# Patient Record
Sex: Female | Born: 1955 | Race: White | Hispanic: No | Marital: Married | Smoking: Never smoker
Health system: Southern US, Community
[De-identification: ages and names within clinical notes are randomized; demographics above are authoritative.]

## PROBLEM LIST (undated history)

## (undated) DIAGNOSIS — I341 Nonrheumatic mitral (valve) prolapse: Secondary | ICD-10-CM

## (undated) DIAGNOSIS — I3139 Other pericardial effusion (noninflammatory): Secondary | ICD-10-CM

## (undated) DIAGNOSIS — M545 Low back pain, unspecified: Secondary | ICD-10-CM

## (undated) DIAGNOSIS — M71422 Calcium deposit in bursa, left elbow: Secondary | ICD-10-CM

## (undated) DIAGNOSIS — K859 Acute pancreatitis without necrosis or infection, unspecified: Secondary | ICD-10-CM

## (undated) DIAGNOSIS — K589 Irritable bowel syndrome without diarrhea: Secondary | ICD-10-CM

## (undated) DIAGNOSIS — Z8489 Family history of other specified conditions: Secondary | ICD-10-CM

## (undated) DIAGNOSIS — M8580 Other specified disorders of bone density and structure, unspecified site: Secondary | ICD-10-CM

## (undated) DIAGNOSIS — M109 Gout, unspecified: Secondary | ICD-10-CM

## (undated) DIAGNOSIS — K5909 Other constipation: Secondary | ICD-10-CM

## (undated) DIAGNOSIS — I10 Essential (primary) hypertension: Secondary | ICD-10-CM

## (undated) DIAGNOSIS — E119 Type 2 diabetes mellitus without complications: Secondary | ICD-10-CM

## (undated) DIAGNOSIS — I313 Pericardial effusion (noninflammatory): Secondary | ICD-10-CM

## (undated) DIAGNOSIS — E785 Hyperlipidemia, unspecified: Secondary | ICD-10-CM

## (undated) DIAGNOSIS — K219 Gastro-esophageal reflux disease without esophagitis: Secondary | ICD-10-CM

## (undated) DIAGNOSIS — J45909 Unspecified asthma, uncomplicated: Secondary | ICD-10-CM

## (undated) DIAGNOSIS — Z8719 Personal history of other diseases of the digestive system: Secondary | ICD-10-CM

## (undated) DIAGNOSIS — I499 Cardiac arrhythmia, unspecified: Secondary | ICD-10-CM

## (undated) DIAGNOSIS — G8929 Other chronic pain: Secondary | ICD-10-CM

## (undated) DIAGNOSIS — G5600 Carpal tunnel syndrome, unspecified upper limb: Secondary | ICD-10-CM

## (undated) DIAGNOSIS — K5900 Constipation, unspecified: Secondary | ICD-10-CM

## (undated) DIAGNOSIS — N200 Calculus of kidney: Secondary | ICD-10-CM

## (undated) DIAGNOSIS — J309 Allergic rhinitis, unspecified: Secondary | ICD-10-CM

## (undated) DIAGNOSIS — M797 Fibromyalgia: Secondary | ICD-10-CM

## (undated) HISTORY — DX: Hyperlipidemia, unspecified: E78.5

## (undated) HISTORY — PX: TONSILLECTOMY AND ADENOIDECTOMY: SUR1326

## (undated) HISTORY — DX: Gout, unspecified: M10.9

## (undated) HISTORY — PX: LUMBAR LAMINECTOMY: SHX95

## (undated) HISTORY — PX: SHOULDER SURGERY: SHX246

## (undated) HISTORY — DX: Other specified disorders of bone density and structure, unspecified site: M85.80

## (undated) HISTORY — DX: Fibromyalgia: M79.7

## (undated) HISTORY — DX: Acute pancreatitis without necrosis or infection, unspecified: K85.90

## (undated) HISTORY — DX: Nonrheumatic mitral (valve) prolapse: I34.1

## (undated) HISTORY — DX: Type 2 diabetes mellitus without complications: E11.9

## (undated) HISTORY — DX: Personal history of other diseases of the digestive system: Z87.19

## (undated) HISTORY — DX: Other pericardial effusion (noninflammatory): I31.39

## (undated) HISTORY — DX: Carpal tunnel syndrome, unspecified upper limb: G56.00

## (undated) HISTORY — DX: Constipation, unspecified: K59.00

## (undated) HISTORY — DX: Allergic rhinitis, unspecified: J30.9

## (undated) HISTORY — DX: Pericardial effusion (noninflammatory): I31.3

## (undated) HISTORY — DX: Gastro-esophageal reflux disease without esophagitis: K21.9

## (undated) HISTORY — DX: Low back pain: M54.5

## (undated) HISTORY — DX: Calculus of kidney: N20.0

## (undated) HISTORY — DX: Irritable bowel syndrome, unspecified: K58.9

## (undated) HISTORY — DX: Low back pain, unspecified: M54.50

## (undated) HISTORY — DX: Other constipation: K59.09

## (undated) HISTORY — DX: Other chronic pain: G89.29

---
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
## 1986-01-09 HISTORY — PX: TOTAL ABDOMINAL HYSTERECTOMY W/ BILATERAL SALPINGOOPHORECTOMY: SHX83

## 1991-01-10 HISTORY — PX: CHOLECYSTECTOMY: SHX55

## 2000-06-11 ENCOUNTER — Inpatient Hospital Stay (HOSPITAL_COMMUNITY): Admission: EM | Admit: 2000-06-11 | Discharge: 2000-06-12 | Payer: Self-pay | Admitting: Emergency Medicine

## 2000-06-11 ENCOUNTER — Encounter: Payer: Self-pay | Admitting: Emergency Medicine

## 2000-06-12 ENCOUNTER — Encounter: Payer: Self-pay | Admitting: Cardiology

## 2002-03-06 ENCOUNTER — Encounter: Payer: Self-pay | Admitting: Family Medicine

## 2002-03-06 ENCOUNTER — Encounter: Admission: RE | Admit: 2002-03-06 | Discharge: 2002-03-06 | Payer: Self-pay | Admitting: Family Medicine

## 2005-09-19 ENCOUNTER — Encounter: Admission: RE | Admit: 2005-09-19 | Discharge: 2005-09-19 | Payer: Self-pay | Admitting: Family Medicine

## 2005-11-09 ENCOUNTER — Ambulatory Visit (HOSPITAL_COMMUNITY): Admission: RE | Admit: 2005-11-09 | Discharge: 2005-11-09 | Payer: Self-pay | Admitting: Neurosurgery

## 2005-12-28 ENCOUNTER — Ambulatory Visit: Payer: Self-pay | Admitting: Cardiology

## 2006-01-11 ENCOUNTER — Ambulatory Visit: Payer: Self-pay | Admitting: Cardiology

## 2006-01-11 ENCOUNTER — Encounter: Payer: Self-pay | Admitting: Cardiology

## 2006-01-11 ENCOUNTER — Ambulatory Visit: Payer: Self-pay

## 2006-01-18 ENCOUNTER — Ambulatory Visit: Payer: Self-pay | Admitting: Cardiology

## 2006-01-31 ENCOUNTER — Inpatient Hospital Stay (HOSPITAL_COMMUNITY): Admission: RE | Admit: 2006-01-31 | Discharge: 2006-02-03 | Payer: Self-pay | Admitting: Orthopedic Surgery

## 2006-07-18 ENCOUNTER — Encounter: Admission: RE | Admit: 2006-07-18 | Discharge: 2006-07-18 | Payer: Self-pay | Admitting: Orthopedic Surgery

## 2006-08-20 ENCOUNTER — Encounter: Admission: RE | Admit: 2006-08-20 | Discharge: 2006-08-20 | Payer: Self-pay | Admitting: Family Medicine

## 2006-09-19 ENCOUNTER — Ambulatory Visit: Payer: Self-pay | Admitting: Cardiology

## 2006-10-12 ENCOUNTER — Encounter: Admission: RE | Admit: 2006-10-12 | Discharge: 2006-10-12 | Payer: Self-pay | Admitting: Orthopedic Surgery

## 2007-03-08 ENCOUNTER — Encounter: Admission: RE | Admit: 2007-03-08 | Discharge: 2007-03-08 | Payer: Self-pay | Admitting: Orthopedic Surgery

## 2007-06-06 ENCOUNTER — Ambulatory Visit: Payer: Self-pay | Admitting: Cardiology

## 2007-06-06 LAB — CONVERTED CEMR LAB
ALT: 171 units/L — ABNORMAL HIGH (ref 0–35)
AST: 182 units/L — ABNORMAL HIGH (ref 0–37)
Alkaline Phosphatase: 122 units/L — ABNORMAL HIGH (ref 39–117)
Bilirubin, Direct: 0.2 mg/dL (ref 0.0–0.3)
CO2: 29 meq/L (ref 19–32)
Chloride: 103 meq/L (ref 96–112)
Cholesterol: 239 mg/dL (ref 0–200)
Creatinine, Ser: 0.9 mg/dL (ref 0.4–1.2)
Hgb A1c MFr Bld: 6 % (ref 4.6–6.0)
Potassium: 3.2 meq/L — ABNORMAL LOW (ref 3.5–5.1)
Total Bilirubin: 1.1 mg/dL (ref 0.3–1.2)
Total CHOL/HDL Ratio: 7.5

## 2007-06-14 ENCOUNTER — Ambulatory Visit: Payer: Self-pay

## 2007-07-17 ENCOUNTER — Ambulatory Visit: Payer: Self-pay | Admitting: Cardiology

## 2008-05-27 ENCOUNTER — Encounter (INDEPENDENT_AMBULATORY_CARE_PROVIDER_SITE_OTHER): Payer: Self-pay | Admitting: *Deleted

## 2008-10-29 DIAGNOSIS — Z8719 Personal history of other diseases of the digestive system: Secondary | ICD-10-CM

## 2008-10-29 DIAGNOSIS — R7309 Other abnormal glucose: Secondary | ICD-10-CM

## 2008-10-29 DIAGNOSIS — E8881 Metabolic syndrome: Secondary | ICD-10-CM

## 2008-10-29 HISTORY — DX: Personal history of other diseases of the digestive system: Z87.19

## 2008-11-06 ENCOUNTER — Ambulatory Visit: Payer: Self-pay | Admitting: Cardiology

## 2009-07-20 ENCOUNTER — Ambulatory Visit: Payer: Self-pay | Admitting: Emergency Medicine

## 2009-07-20 DIAGNOSIS — J309 Allergic rhinitis, unspecified: Secondary | ICD-10-CM

## 2009-07-20 DIAGNOSIS — K219 Gastro-esophageal reflux disease without esophagitis: Secondary | ICD-10-CM | POA: Insufficient documentation

## 2009-07-20 DIAGNOSIS — R0602 Shortness of breath: Secondary | ICD-10-CM

## 2009-07-20 HISTORY — DX: Allergic rhinitis, unspecified: J30.9

## 2010-01-30 ENCOUNTER — Encounter: Payer: Self-pay | Admitting: Family Medicine

## 2010-02-10 NOTE — Assessment & Plan Note (Signed)
Summary: dyspnea   Visit Type:  Initial Consult Copy to:  Dr. Keturah Barre Primary Provider/Referring Provider:  Lucila Maine..[facility] in [place]  CC:  Pulmonary consult for dyspnea.  The patient c/o increased sob with exertion x2 months. The patient was given a sample of Symbicort and says her breathing improved while using this medication.Marland Kitchen  History of Present Illness: 55 yo never smoker, hx of HTN, allergic rhinitis, small benign pericardial effusion (1991) stable by TTE in 1/11 (Dr Daleen Squibb). Referred for evaluation of dyspnea and cough. She reports that she was well until 2 months ago, when she developed URI symptoms. Primarily dry cough, non-prod at first but evolved clear sputum. Was treated with corticosteroids, abx. Then she was started on BD's - Symbicort and albuterol/atrovent. Currently on nasal steroid, no NSW's. She is still having UA irritation, hoarse voice, some SOB and UA wheezing.   Preventive Screening-Counseling & Management  Alcohol-Tobacco     Alcohol drinks/day: 0     Smoking Status: never  Current Medications (verified): 1)  Tenormin 50 Mg Tabs (Atenolol) .Marland Kitchen.. 1 Tab Once Daily 2)  Nexium 40 Mg Cpdr (Esomeprazole Magnesium) .Marland Kitchen.. 1 Tab Two Times A Day 3)  Lidoderm 5 % Ptch (Lidocaine) .Marland Kitchen.. 1 Patch Every 12 Hours On and Then 12 Hours Off 4)  Artificial Tears 1.4 % Soln (Polyvinyl Alcohol) .... Use As Directed in Ou Bid 5)  Hydrocodone-Acetaminophen 10-325 Mg Tabs (Hydrocodone-Acetaminophen) .Marland Kitchen.. 1 Tab Three Times A Day 6)  Flexeril 10 Mg Tabs (Cyclobenzaprine Hcl) .Marland Kitchen.. 1 Tab Three Times A Day 7)  Miralax  Powd (Polyethylene Glycol 3350) .... Use As Directed 8)  Betamethasone Valerate 0.1 % Crea (Betamethasone Valerate) .... Use As Directed Daily 9)  Nasacort Aq 55 Mcg/act Aers (Triamcinolone Acetonide(Nasal)) .... As Needed 10)  Hyomax-Sl 0.125 Mg Subl (Hyoscyamine Sulfate) .... As Needed 11)  Meclizine Hcl 25 Mg Tabs (Meclizine Hcl) .... As  Needed 12)  Amoxicillin 500 Mg Caps (Amoxicillin) .... As Needed For Dental Work 13)  Ibuprofen 200 Mg Tabs (Ibuprofen) .... As Needed 14)  Symbicort 160-4.5 Mcg/act Aero (Budesonide-Formoterol Fumarate) .... 2 Puffs Two Times A Day 15)  Ventolin Hfa 108 (90 Base) Mcg/act Aers (Albuterol Sulfate) .... 2 Puffs Every 6 Hours As Needed 16)  Atrovent Hfa 17 Mcg/act Aers (Ipratropium Bromide Hfa) .... 2 Puffs Every 6 Hours As Needed  Allergies: 1)  ! Celebrex 2)  ! Vioxx 3)  ! Sulfa 4)  ! * Ambien  Family History: Mother: deceased at 52 .Marland KitchenCHF Father: deceased at 39.Marland KitchenCHF and COPD Family History Asthma---father Family History Breast Cancer---mother, aunt and sister Family History Colon Cancer---mother Family History Lung Cancer---mother History of stomach cancer---aunt Family History Emphysema ---mother and father  Social History: Alcohol drinks/day:  0  Review of Systems       The patient complains of shortness of breath with activity, shortness of breath at rest, productive cough, indigestion, loss of appetite, nasal congestion/difficulty breathing through nose, sneezing, and joint stiffness or pain.  The patient denies non-productive cough, coughing up blood, chest pain, irregular heartbeats, acid heartburn, weight change, abdominal pain, difficulty swallowing, sore throat, tooth/dental problems, headaches, itching, ear ache, anxiety, depression, hand/feet swelling, rash, change in color of mucus, and fever.    Vital Signs:  Patient profile:   55 year old female Height:      65 inches (165.10 cm) Weight:      229 pounds (104.09 kg) BMI:     38.25 O2 Sat:  100 % on Room air Temp:     98.0 degrees F (36.67 degrees C) oral Pulse rate:   88 / minute BP sitting:   124 / 80  (left arm) Cuff size:   large  Vitals Entered By: Michel Bickers CMA (July 20, 2009 1:57 PM)  O2 Sat at Rest %:  100 O2 Flow:  Room air CC: Pulmonary consult for dyspnea.  The patient c/o increased sob with  exertion x2 months. The patient was given a sample of Symbicort and says her breathing improved while using this medication. Comments Medications reviewed. Daytime phone verified. Michel Bickers CMA  July 20, 2009 2:10 PM   Physical Exam  General:  normal appearance, healthy appearing, and obese.   Head:  normocephalic and atraumatic Eyes:  conjunctiva and sclera clear Nose:  no deformity, discharge, inflammation, or lesions Mouth:  no deformity or lesions Neck:  no masses, thyromegaly, or abnormal cervical nodes Lungs:  clear bilaterally to auscultation Heart:  regular rate and rhythm, S1, S2 without murmurs, rubs, gallops, or clicks Abdomen:  not examined Msk:  no deformity or scoliosis noted with normal posture Extremities:  no clubbing, cyanosis, edema, or deformity noted Neurologic:  non-focal Skin:  intact without lesions or rashes Psych:  alert and cooperative; normal mood and affect; normal attention span and concentration   X-ray  Procedure date:  05/07/2009  Findings:      CXR: no infiltrates, normal film  Impression & Recommendations:  Problem # 1:  DYSPNEA (ICD-786.05)  This seems to be manifesting itself as throat irritation, cough, wheeze. Suspect residual UA irritation after URI. The Symbicort may be irritating this. Other contributors probably allergies and GERD.  - Nasonex + NSW + loratadine - double nexium for 2 weeks.  - stop symbicort - full PFT to r/o lower airways disease - rov 1 month  Orders: Consultation Level IV (81191)  Medications Added to Medication List This Visit: 1)  Symbicort 160-4.5 Mcg/act Aero (Budesonide-formoterol fumarate) .... 2 puffs two times a day 2)  Ventolin Hfa 108 (90 Base) Mcg/act Aers (Albuterol sulfate) .... 2 puffs every 6 hours as needed 3)  Atrovent Hfa 17 Mcg/act Aers (Ipratropium bromide hfa) .... 2 puffs every 6 hours as needed  Patient Instructions: 1)  Continue your nasonex 2 sprays each side once daily  2)   Start nasal saline washes once daily  3)  Start loratadine 10mg  by mouth once daily  4)  Increase your Nexium to two times a day for the next 2 weeks, then go back to once daily  5)  Stop Symbicort 6)  We will perform full PFT's at the time of your next appointment.  7)  Follow up with Dr Delton Coombes in 1 months or as needed

## 2010-05-24 NOTE — Assessment & Plan Note (Signed)
[facility]                            CARDIOLOGY OFFICE NOTE   NAME:Arnold, Carolyn RANDOL                        MRN:          [id number]  DATE:07/17/2007                            DOB:          11-05-1955    [name] returns today for followup.  She has been recently evaluated  by Dr. Lucila Maine.  I do not have his lab work.  She says her liver  function studies look much better.  She has changed her lifestyle and  her diet dramatically.  Her weight is down 1 pound.   When I checked blood work on Jun 06, 2007, her SGOT was 182.  Her SGPT  was 171.  This was higher than previous lab results.  Her total  cholesterol was 239 and HDL was 32.  Her LDL cholesterol was 186.2,  triglycerides 101, and her potassium was 3.2.  We also checked her  hemoglobin A1c, which was 6.0%, which is upper limits of normal range.   Her blood pressure today is 110/84, pulse 64 and regular, and weight is  240.  Rest of her exam is unchanged.   I had a long talk with [name] today.  She clearly is not someone that  I would treat with a statin, knowing about her history of increased LFTs  probably secondary to fatty liver.  If she dramatically changes her  lifestyle and LFTs normalize then I would consider a statin, however.   At the present time, I think therapeutic lifestyle choices are the best  approach.  By losing weight, she can probably also avoid type 2  diabetes.   I will see her back in 12 months or p.r.n.Marland Kitchen  She will follow closely  with Dr. Lorin Picket.     Thomas C. Daleen Squibb, MD, [facility]  Electronically Signed    TCW/MedQ  DD: 07/17/2007  DT: 07/18/2007  Job #: [id number]   cc:   Molly Maduro MD Lorin Picket

## 2010-05-24 NOTE — Assessment & Plan Note (Signed)
[facility]                            CARDIOLOGY OFFICE NOTE   NAME:Carolyn Arnold                        MRN:          [id number]  DATE:06/06/2007                            DOB:          1955-05-28    Ms. [name] returns today for further management of her metabolic syndrome.  Last time I saw her on September 19, 2006, I asked her to followup with  Dr. Lorin Picket concerning her glucose intolerance and whether or not she  could use a statin.  Per her recall, Dr. Lorin Picket felt she was still  borderline, but not a type 2 diabetic yet.  I do not have any laboratory  data.   Unfortunately, she continues to gain weight and has gone from 238 to  241.  She blames this on the fact she cannot exercise though I have  tried to tell her that she is not going to lose weight by doing such.  She would have to exercise or swim 10 hours a week just to lose 1 pound,  even if she kept her diet stable.  She says she loves potatoes and she  adds sugar to her coffee, need I say more.   She is a delightful lady and now unfortunately is in a high risk  category of having an acute coronary event.  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  Fortunately, she does not smoke.  Blood pressure has been under good  control.   She has been plagued with elevated LFTs in the past and that has  precluded somewhat trying a statin.  This has been attributed to fatty  liver in the past.  Please see my previous notes.   She is having some chest discomfort at present.  It is not always  exertion related.  It is quite worrisome, however.  She denies  orthopnea, PND or peripheral edema.   Her current medications are:  1. Tenormin 50 mg a day.  2. Hydroxyzine 25 mg a day.  3. Xanax 0.5 mg a day.  4. Nexium 40 mg p.o. b.i.d.  5. Lidoderm patches.  6. Artificial tears.  7. Acai Berry extract 2 twice a day.   Her blood pressure today is  110/80, her pulse is 78 and regular.  Weight  is 241, up 3.  HEENT:  Normocephalic, atraumatic.  PERRLA.  Extraocular movements  intact.  Sclerae clear.  Facial symmetry is normal.  Carotid upstrokes are equal bilaterally without bruits, no JVD.  Thyroid  is not enlarged.  Trachea is midline.  LUNGS:  Clear.  HEART:  Reveals a poorly appreciated PMI.  Normal S1, S2.  ABDOMINAL EXAM:  Soft, good bowel sounds.  No midline bruit.  EXTREMITIES:  No cyanosis, clubbing or edema.  Pulses are intact.  NEURO EXAM:  Grossly intact.  SKIN:  Unremarkable.  MUSCULOSKELETAL:  Shows chronic arthritic changes.   I had a long talk with Ms. Dobbins today.  I feel very strongly that she  needs a major risk factor  modification.  Fortunately, her blood pressure  is under good control, but I am very concerned that she is early  diabetic.  In addition, I am very concerned she needs her lipids  treated.  Please see my previous notes.   PLAN:  1. CMP, lipid panel, C-reactive protein and hemoglobin A1c today.  2. Adenosine rest stress Myoview to rule out obstructive coronary      disease.   I will have her return in 4 weeks.     Thomas C. Daleen Squibb, MD, [facility]  Electronically Signed    TCW/MedQ  DD: 06/06/2007  DT: 06/06/2007  Job #: [id number]   cc:   Lucila Maine, MD

## 2010-05-24 NOTE — Assessment & Plan Note (Signed)
[facility]                            CARDIOLOGY OFFICE NOTE   NAME:Carolyn Arnold, Carolyn Arnold                        MRN:          [id number]  DATE:09/19/2006                            DOB:          04-06-1955    Carolyn Arnold returns today for further management of her metabolic syndrome.  She had a fasting blood sugar recently of 122 by Dr. Lorin Picket.  He  apparently performed a glucose-tolerance test on her, which she does not  have the results of yet.   Her hemoglobin A1c in January here was still within normal limits.  She  is glucose intolerant and will soon be a full-blown diabetic if not  already.   Because of her arthritis and her ability to get around, her weight has  stayed the same at 238.  Her blood pressure has been under good control  however, and she remains on Tenormin 50 mg at bedtime.   Her most recent lipid profile showed a total cholesterol of 229, HDL 39,  triglycerides 152, LDL of 160.  Her SGPT, SGOT were elevated at 115 and  116, which is not quite 3 times normal.  This is probably fatty liver as  she was also told by Dr. Lorin Picket.  Apparently, she has refused to have a  liver biopsy in the past per a note I see.   Her blood pressure today is 110/78.  Her pulse is 78 and regular.  Her  weight is 238.  HEENT:  Unchanged.  CAROTIDS:  Full without bruits.  There is no jugular venous distention.  Thyroid is not enlarged.  Trachea is midline.  LUNGS:  Clear.  HEART:  Revealed a regular rate and rhythm without gallop.  PMI could not be  appreciated.  ABDOMINAL EXAMINATION:  Obese with good sounds.  Organomegaly could not  be appreciated.  EXTREMITIES:  Reveal trace to 1+ pitting edema. Pulses are intact.  NEUROLOGICAL EXAMINATION:  Is intact.  Her electrocardiogram shows  normal sinus rhythm with low voltage QRS.  There has been no change.   ASSESSMENT:  Carolyn Arnold is probably now type 2 diabetic.  If that is the  case, I would recommend a statin to  lower LDL below 100.  I would  certainly shoot for 30 to 40% lowering of LDL if at all possible.  The  fact that her LFTs are up complicates matters but this is probably fatty  liver and could be monitored closely.  She states that she took a statin  in the past from Dr. Lorin Picket but she could not tolerate it.   After much discussion, I decided to let her follow up with Dr. Lorin Picket  concerning her glucose intolerance and question of whether she needs a  statin or not.  He will have to monitor it anyway.  He will also know  which statin she could not take in the past.   Again, I encouraged her to try to keep her weight down.  I will plan on  seeing her back in 6 months.     Thomas C. Wall, MD,  [facility]  Electronically Signed    TCW/MedQ  DD: 09/19/2006  DT: 09/20/2006  Job #: [id number]   cc:   Carolyn Arnold

## 2010-05-27 NOTE — Op Note (Signed)
NAMERAINAH, KIRSHNER                 ACCOUNT NO.:  [id number]   MEDICAL RECORD NO.:  [id number]          PATIENT TYPE:  INP   LOCATION:  2550                         FACILITY:  [facility]   PHYSICIAN:  Nelda Severe, MD      DATE OF BIRTH:  [birth year]   DATE OF PROCEDURE:  01/31/2006  DATE OF DISCHARGE:                               OPERATIVE REPORT   SURGEON:  Dr. Nelda Severe.   ASSISTANT:  Lianne Cure, PA-C   PREOPERATIVE DIAGNOSIS:  L5-S1 isthmic spondylolisthesis, status post  Ray cage fusion, L4-5 spondylosis with pseudoarthrosis at L5-S1.   POSTOPERATIVE DIAGNOSIS:  L5-S1 isthmic spondylolisthesis, status post  Ray cage fusion, L4-5 spondylosis with pseudoarthrosis at L5-S1.   OPERATIVE PROCEDURE:  L4-5, L5-S1 posterolateral fusion with a pedicle  screws (Invictus/Aesculap), rods and right posterior iliac crest  autogenous graft.   OPERATIVE NOTE:  The patient was placed under general endotracheal  anesthesia.  A gram of vancomycin was infused.  Sequential compression  devices were placed on both lower extremities.  Electronic monitoring  devices were placed on the scalp, upper extremities and lower  extremities.   The patient was positioned prone on the [place] frame.  Care was taken  to position the upper extremities so as to avoid hyperflexion and  abduction of the shoulders and hyperflexion of the elbows.  Upper  extremities were padded from axilla to hands with foam.  There was no  pressure on the cubital tunnels.  The thighs, knees and shins were  padded with pillows.   The previous incision in lumbar area was marked with a skin marker.  Lumbar area was prepped with DuraPrep.  Drapes were placed and secured  with Ioban.   The skin was scored in line with the incision including an ellipse  around the previous scar.  The subcutaneous tissue was injected with a  mixture of 0.25% Marcaine with epinephrine 1% lidocaine.   The scar was excised and the incision  extended approximately 2 cm  distally and proximally from the previous incision.  Dissection was  carried down through a very thick adipose layer to identify the spinous  processes.  Paraspinal muscles reflected bilaterally.  Cross-table  lateral radiograph was taken with a Kocher on what proved to be the L4  spinous process.  The transverse process of L4, transverse process of L5  and ala of the sacrum was exposed bilaterally.   Pedicle holes were made at S1, L5 and L4, first on the left and then on  the right.  Radiopaque markers were placed.  Cross-table lateral  radiograph showed satisfactory position of markers except for the need  to direct the left-sided S1 hole slightly more distally.   In the meantime we harvested a right posterior iliac crest graft through  a short vertical incision made just lateral to the iliac crest.  The  soft tissue on the superior aspect of the crest was removed and the  crest fenestrated with a Leksell rongeur.  A special bone harvesting  tube was then used to remove a satisfactory quantity of cancellous graft  from between the inner and outer tables of the iliac crest on the right  side.  The bony defect was packed with Gelfoam.   The bone graft harvested was then mixed in 15 mL of Vitoss (beta  tricalcium phosphate) morsels.   Next we decorticated the ala of sacrum and transverse processes of L5-L4  as well as the lateral aspect of the superior articular process of L5  and L4 on the right side.  Half of the graft/Vitoss mixture was then  inserted posterolaterally from the ala of the sacrum distally to the  lateral aspect of the L4 superior articular process.   Next screws were inserted at S1, L5 and L4 on the left side.  Each screw  was tested and the current to stimulate distal EMG activity recorded.  Each instance the current was at a high enough level to suggest there  was very little likelihood of any contact between screw and nerve root.   Precontoured rod was then attached provisionally coupled.   We then moved to the left side where the decortication the ala of the  sacrum, lateral surface of the superior articular process of S1, L5 and  L4 as well as transverse processes of L4 and L5 was carried out.  The  rest of the graft was packed in posterolaterally.  We did resect the L4-  5 apophyseal joint on the left side as well.   Next screws were placed and stimulated as described for the right side.  As noted above, the left S1 screw was directed slightly more distally.  All stimulation levels were measured and in each instance, distal EMG  activity was not triggered at levels suggestive of any contact between  nerve root and screw.  Cross-table lateral radiograph was taken after  provisionally fixating a precontoured titanium rod to the screws at S1,  L5 and L4.  The set screws were then all torqued.  The tabs were broken  off the top of the screws and a total of 12 tabs given to the scrub  tech.   We then closed the fascia with continuous #1 Vicryl suture.  A one-  eighth inch Hemovac drain was placed subcutaneous layer of the central  wound, brought out through the subcutaneous layer of the bone graft  harvest wound.  Subcutaneous layer of both wounds was closed using  continuous and interrupted 2-0 Vicryl suture.  The skin was then closed  with a subcuticular running 3-0 undyed Vicryl suture.  Skin edges were  reinforced with Steri-Strips.  An antibiotic ointment dressing was  applied and secured with OpSite after the eighth inch Hemovac drain was  secured with a basket weave type of 2-0 nylon suture.   There no intraoperative complications.  The blood loss estimated at  about 400 mL.   Sponge, needle counts were correct.      Nelda Severe, MD  Electronically Signed     MT/MEDQ  D:  01/31/2006  T:  01/31/2006  Job:  [id number]

## 2010-05-27 NOTE — Assessment & Plan Note (Signed)
[facility]                            CARDIOLOGY OFFICE NOTE   NAME:Kowalczyk, ALEINA BURGIO                        MRN:          [id number]  DATE:01/18/2006                            DOB:          July 31, 1955    Ms. Bertelson returns today to discuss the findings of her 2D echocardiogram  and her blood work.  Please see my note from December 28, 2005.   She clearly has metabolic syndrome with borderline blood pressure,  __________ obesity, and glucose intolerance, and mixed hyperlipidemia.  Her blood sugar was 111 fasting.  Her total cholesterol was 250,  triglycerides 189, HDL 33.4, LDL 148.2.  The rest of her blood work was  normal.  Her hemoglobin A1c was 2, within normal range of 5.6%.   Her 2D echocardiogram was essentially normal except for some mild left  atrial enlargement.   I have sat with Ms. Ironside for about 15 minutes and discussed again  therapeutic lifestyle changes.  She is so immobile with her back  problem, she has to go on a very strict diet.  I have advised her to try  to lose 3-4 pounds a month.  I will see her back in 6 months.  If she  has lost about 20 pounds, we will recheck fasting blood work.  She seems  motivated, and hopefully will stay on track.     Thomas C. Daleen Squibb, MD, [facility]  Electronically Signed    TCW/MedQ  DD: 01/18/2006  DT: 01/18/2006  Job #: [id number]   cc:   Nelda Severe, MD  Dr. Lucila Maine

## 2010-05-27 NOTE — Assessment & Plan Note (Signed)
[facility]                            CARDIOLOGY OFFICE NOTE   NAME:Carolyn Arnold                        MRN:          [id number]  DATE:12/28/2005                            DOB:          1955-05-03    CHIEF COMPLAINT:  Clearance for surgery on her back.   HISTORY OF PRESENT ILLNESS:  [name] is a delightful 55 year old  married white female from [place] who is scheduled to have lower back  surgery some time in January.  She is having a lot of pain and her  mobility has been significantly reduced.   She has had several cardiac evaluations in the past for atypical chest  pain.  The most recent evaluation was in March 2005.  She had an  adenosine Cardiolite which was negative for ischemia with normal  systolic function, EF of 67%.  In addition, she had a 2D echocardiogram  at the time that showed a tiny pericardial effusion, EF of 60% to 65%.  Her left atrium was mildly enlarged.  She had minimal mitral  regurgitation.   She has been evaluated for palpitations in the past and has been told  she premature beats.   She does have several cardiac risk factors including obesity, sedentary  lifestyle, hyperlipidemia, metabolic syndrome.   In fact, looking back at her blood work in 2005, she had an elevated  blood sugar of 110, she had a low HDL of 30.5, total cholesterol of 245,  triglycerides of 192, LDL 176.  Her SGOT and SGPT were increased  substantially at 110 and 149.  In fact, she has had a cholecystectomy,  but she ended up having an ultrasound at that time which showed diffuse  fatty infiltration of her liver.   She has done nothing since then to lose weight and has not been focused  on this as she admits today.   PAST MEDICAL HISTORY:  SHE IS INTOLERANT OF SULFA, [employer] INHIBITORS,  AMBIEN, IBUPROFEN.  SHE DID HAVE SEVERE NAUSEA WITH A MYELOGRAM IN THE  PAST, BUT NO DYE REACTION PER SE.   She does not drink, smoke, does not use any  recreational drugs.   SURGICAL HISTORY:  1. She had a hysterectomy in 1990.  2. Back surgery in 1998.  3. Cholecystectomy with pancreatitis in 2004.  4. Shoulder surgery x2 for rotator cuff in 2000.  5. Fractured elbow in 1973.   CURRENT MEDICATIONS:  1. Tenormin 50 mg p.o. nightly.  2. Hydroxyzine 25 mg 2 p.o. nightly.  3. Xanax 1 mg 1 to 2 p.o. nightly p.r.n.  4. Nexium 40 mg 2 p.o. b.i.d.  5. Lorabid 7.5/500 one q.6 h. p.r.n.  6. Flexeril 10 mg 1 p.o. b.i.d. or t.i.d.  7. Betamethasone cream.  8. Cortisone nasal spray p.r.n.   SOCIAL HISTORY:  She is a Designer, jewellery, currently on disability.  She is married and has 2 children.   REVIEW OF SYSTEMS:  Other than the HPI, pertinent positives are a  history of mild diverticulosis, no melena or hematochezia, history of  chronic reflux, history of arthritis.  EXAMINATION:  Her blood pressure today is 129/89.  Heart rate is 65, in  sinus rhythm.  She is 5 feet 5 inches and weighs 230.  She has nonspecific ST segment changes on her EKG, which are unchanged  since 2005 by the way.  She is in no acute distress.  SKIN:  Warm and dry.  HEENT:  Normocephalic and atraumatic.  PERRLA.  Extraocular movements  intact.  Sclerae clear.  Facial symmetry is normal.  Dentition is  satisfactory.  Carotid upstrokes are equal bilaterally without bruits.  There is no  JVD.  Thyroid is not enlarged. Neck is supple.  LYMPH EXAM:  Negative.  LUNGS:  Clear to auscultation and percussion.  HEART:  Reveals a poorly appreciated PMI.  She has normal S1 and S2.  ABDOMINAL EXAM:  Protuberant with good bowel sounds.  Organomegaly could  not be assessed.  EXTREMITIES:  Reveal trace edema bilaterally.  Pulses were present.  NEUROLOGIC EXAM:  Grossly intact.  MUSCULOSKELETAL:  Other than her back injury, is intact.  SKIN:  Intact.   ASSESSMENT:  1. Atypical chest pain.  2. Palpitations.  3. History of PACs and PVCs.  4. Metabolic syndrome, or she  could be early diabetic already.   PLAN:  1. A 2D echocardiogram.  2. Fasting lipids, comprehensive metabolic panel, hemoglobin A1c, TSH,      magnesium.   She would like to sit down and talk about this once the results are in.  I spent a lot of time, greater than 20 minutes, talking to her about  metabolic syndrome and how to avoid diabetes  down the road.  Hope she can implement a program that she will be  proactive on after her surgery.     Thomas C. Daleen Squibb, MD, [facility]  Electronically Signed    TCW/MedQ  DD: 12/28/2005  DT: 12/28/2005  Job #: [id number]   cc:   Nelda Severe, MD  Lucila Maine, MD

## 2010-05-27 NOTE — Discharge Summary (Signed)
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  The day of admission she was taken to the operating room  where L4-5, L5-S1 posterolateral fusion with pedicle screws, rod and  autogenous bone graft was carried out.  Postoperatively, her course was  uncomplicated.  She was able to ambulate in physical therapy.  Her pain  control was adequate with oral analgesics (Norco 10) at the time of  discharge.   Discharge medications include  Norco 10, 90 tablets, p.r.n. for pain; Flexeril 10 mg t.i.d. p.r.n. for  spasm, 40 tablets; K-Dur 20 mEq b.i.d. for 7 days.   She is instructed to follow up with me in one month in the office and to  attend her primary care physician, Dr. Lorin Picket in [place] with regards to  hypokalemia.   Her wound was stable.  She was ambulatory with a walker.  She was  instructed to not bend or lift.   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   See above for postoperative instructions, medications, etc.      Nelda Severe, MD  Electronically Signed     MT/MEDQ  D:  03/30/2006  T:  03/30/2006  Job:  [id number]

## 2010-05-27 NOTE — Discharge Summary (Signed)
[place]  Patient:    Carolyn Arnold, Carolyn Arnold                        MRN: [id number] Adm. Date:  [id number] Disc. Date: 06/12/00 Attending:  Mirian Mo Dictator:   Jacolyn Reedy, P.A.C. CC:         Darrell Jewel, M.D.   Referring Physician Discharge [facility]  ADMITTING DIAGNOSIS:  Chest pain.  DISCHARGE DIAGNOSES: 1. Chest pain, question etiology.  Preliminary Cardiolite - probably breast    attenuation but resting study to be completed tomorrow.  Discharged home    to go to her fathers funeral. 2. Anxiety. 3. Prior history of pericardial effusion. 4. History of sinus tachycardia and premature ventricular contractions treated    with beta blockers. 5. Borderline hypertension. 6. Mild hyperlipidemia. 7. Family history of coronary artery disease.  BRIEF HISTORY AND PHYSICAL:  Please see H&P for details.  This is a 55 year old married white female patient of Dr. Anola Gurney with history of pericardial effusion of unknown etiology a few years ago.  She has multiple cardiac risk factors and now presents with intermittent chest pain, dyspnea for two weeks.  She initially had upper respiratory symptoms followed by upper chest pain with tenderness.  She was treated with antibiotics and Naprosyn without improvement.  She then developed abdominal fullness and lower chest burning with dyspnea.  She had some improvement with antacids but no benefit from her Prilosec.  She does have new T wave inversion on EKG and stress Cardiolite had been scheduled but was not done.  She came to the emergency room for further evaluation.  HOSPITAL COURSE:  The patient ruled out for an MI with negative CKs, MBs, and EKGs.  CT of the chest was reportedly negative.  Stress Cardiolite on the patient - baseline EKG showed normal sinus rhythm with anterior T wave inversion.  She exercised five minutes 30 seconds of the Bruce protocol obtaining a heart rate of 150; target  heart rate was 147.  The patient complained of chest burning, shortness of breath, and fatigue, and had 0.5 to 1 mm lateral ST depression and improvement in her T wave inversion. Cardiolite images showed question of breast attenuation but they recommended a resting study to be sure of this.  She also had a 2-D echo and preliminary shows mild MR.  Because her fathers funeral is today at 11 a.m. and she would like to attend, we will discharge her home and she will return tomorrow for her resting study.  She has been given a prescription for nitroglycerin and told to return to the hospital if she has recurrent chest pain.  If not, we will see her in the morning for her resting study.  She is also to call for a follow-up appointment with Dr. Juanito Doom in two weeks.  DISCHARGE MEDICATIONS: 1. Tenormin 50 mg q.d. 2. Premarin 0.625 mg q.d. 3. Prilosec 20 mg q.d. 4. amitriptyline 50 mg q.h.s. p.r.n. 5. Coated aspirin once a day. 6. Nitroglycerin p.r.n. 7. Ativan 0.5 mg t.i.d. p.r.n. - prescription of 30 tablets was given with no    refills.  LABORATORY VALUES:  A pH was 7.36, PCO2 40, PO2 48, bicarb 23, oxygen saturation 82 on admission.  Repeat ABG showed pH 7.44, PCO2 30, PO2 122 on room air - I believe the first one must have been a venous stick.  Troponins negative, CK-MBs negative. DD:  06/12/00 TD:  06/12/00 Job: [id number]

## 2011-04-25 ENCOUNTER — Other Ambulatory Visit: Payer: Self-pay | Admitting: Geriatric Medicine

## 2011-04-26 ENCOUNTER — Other Ambulatory Visit: Payer: Self-pay | Admitting: Geriatric Medicine

## 2011-04-26 DIAGNOSIS — Z1231 Encounter for screening mammogram for malignant neoplasm of breast: Secondary | ICD-10-CM

## 2011-04-28 ENCOUNTER — Ambulatory Visit
Admission: RE | Admit: 2011-04-28 | Discharge: 2011-04-28 | Disposition: A | Discharge status: Home / Self Care | Payer: BC Managed Care – PPO | Source: Ambulatory Visit | Attending: Geriatric Medicine | Admitting: Geriatric Medicine

## 2011-04-28 ENCOUNTER — Other Ambulatory Visit: Payer: Self-pay | Admitting: Geriatric Medicine

## 2011-04-28 DIAGNOSIS — N63 Unspecified lump in unspecified breast: Secondary | ICD-10-CM

## 2011-04-28 DIAGNOSIS — Z1231 Encounter for screening mammogram for malignant neoplasm of breast: Secondary | ICD-10-CM

## 2011-11-16 ENCOUNTER — Encounter (HOSPITAL_BASED_OUTPATIENT_CLINIC_OR_DEPARTMENT_OTHER): Payer: BC Managed Care – PPO

## 2012-01-29 ENCOUNTER — Telehealth: Payer: Self-pay | Admitting: Cardiology

## 2012-01-29 NOTE — Telephone Encounter (Signed)
Patient was last seen in 2010 and was only due to come back prn.  She is needing to have carpel tunnel surgery by Dr. Ulice Brilliant at [facility].  He will not schedule the surgery until patient has been given clearance.  They are only doing a block on her arm and not going to be put her to sleep.  Do you want me to schedule an appointment with you and another MD.  Will ask Dr Daleen Squibb  and call patient back.  She is not having any problems with her heart and will come in if needed.  Once clearance given, please fax to Dr. Lynnette Caffey at [phone].  No date for surgery has been scheduled

## 2012-01-29 NOTE — Telephone Encounter (Signed)
Note faxed to [facility] 

## 2012-01-29 NOTE — Telephone Encounter (Signed)
Pt  has to have carpel tunnel surgery and needs surgical clearance

## 2012-01-29 NOTE — Telephone Encounter (Signed)
You can clear her. No need for office visit.

## 2012-05-01 ENCOUNTER — Other Ambulatory Visit: Payer: Self-pay | Admitting: Orthopedic Surgery

## 2012-05-01 DIAGNOSIS — Z981 Arthrodesis status: Secondary | ICD-10-CM

## 2012-05-03 ENCOUNTER — Ambulatory Visit
Admission: RE | Admit: 2012-05-03 | Discharge: 2012-05-03 | Disposition: A | Discharge status: Home / Self Care | Payer: BC Managed Care – PPO | Source: Ambulatory Visit | Attending: Orthopedic Surgery | Admitting: Orthopedic Surgery

## 2012-05-03 DIAGNOSIS — Z981 Arthrodesis status: Secondary | ICD-10-CM

## 2014-01-09 HISTORY — PX: OTHER SURGICAL HISTORY: SHX169

## 2014-06-16 DIAGNOSIS — M19011 Primary osteoarthritis, right shoulder: Secondary | ICD-10-CM | POA: Diagnosis not present

## 2015-01-10 HISTORY — PX: KNEE ARTHROSCOPY: SHX127

## 2015-02-09 DIAGNOSIS — M1711 Unilateral primary osteoarthritis, right knee: Secondary | ICD-10-CM | POA: Diagnosis not present

## 2015-02-25 DIAGNOSIS — I10 Essential (primary) hypertension: Secondary | ICD-10-CM | POA: Diagnosis not present

## 2015-02-25 DIAGNOSIS — F428 Other obsessive-compulsive disorder: Secondary | ICD-10-CM | POA: Diagnosis not present

## 2015-02-25 DIAGNOSIS — K760 Fatty (change of) liver, not elsewhere classified: Secondary | ICD-10-CM | POA: Diagnosis not present

## 2015-02-25 DIAGNOSIS — M2550 Pain in unspecified joint: Secondary | ICD-10-CM | POA: Diagnosis not present

## 2015-02-25 DIAGNOSIS — E7800 Pure hypercholesterolemia, unspecified: Secondary | ICD-10-CM | POA: Diagnosis not present

## 2015-03-04 DIAGNOSIS — L299 Pruritus, unspecified: Secondary | ICD-10-CM | POA: Diagnosis not present

## 2015-03-04 DIAGNOSIS — L0100 Impetigo, unspecified: Secondary | ICD-10-CM | POA: Diagnosis not present

## 2015-03-04 DIAGNOSIS — L300 Nummular dermatitis: Secondary | ICD-10-CM | POA: Diagnosis not present

## 2015-03-15 DIAGNOSIS — M1711 Unilateral primary osteoarthritis, right knee: Secondary | ICD-10-CM | POA: Diagnosis not present

## 2015-03-15 DIAGNOSIS — M238X1 Other internal derangements of right knee: Secondary | ICD-10-CM | POA: Diagnosis not present

## 2015-03-15 DIAGNOSIS — M7062 Trochanteric bursitis, left hip: Secondary | ICD-10-CM | POA: Diagnosis not present

## 2015-03-24 DIAGNOSIS — M1711 Unilateral primary osteoarthritis, right knee: Secondary | ICD-10-CM | POA: Diagnosis not present

## 2015-04-12 DIAGNOSIS — M1711 Unilateral primary osteoarthritis, right knee: Secondary | ICD-10-CM | POA: Diagnosis not present

## 2015-04-12 DIAGNOSIS — S83241A Other tear of medial meniscus, current injury, right knee, initial encounter: Secondary | ICD-10-CM | POA: Diagnosis not present

## 2015-04-12 DIAGNOSIS — S83281A Other tear of lateral meniscus, current injury, right knee, initial encounter: Secondary | ICD-10-CM | POA: Diagnosis not present

## 2015-05-06 DIAGNOSIS — Y999 Unspecified external cause status: Secondary | ICD-10-CM | POA: Diagnosis not present

## 2015-05-06 DIAGNOSIS — G8918 Other acute postprocedural pain: Secondary | ICD-10-CM | POA: Diagnosis not present

## 2015-05-06 DIAGNOSIS — X58XXXA Exposure to other specified factors, initial encounter: Secondary | ICD-10-CM | POA: Diagnosis not present

## 2015-05-06 DIAGNOSIS — M23321 Other meniscus derangements, posterior horn of medial meniscus, right knee: Secondary | ICD-10-CM | POA: Diagnosis not present

## 2015-05-06 DIAGNOSIS — M94261 Chondromalacia, right knee: Secondary | ICD-10-CM | POA: Diagnosis not present

## 2015-05-06 DIAGNOSIS — M1711 Unilateral primary osteoarthritis, right knee: Secondary | ICD-10-CM | POA: Diagnosis not present

## 2015-05-06 DIAGNOSIS — Y939 Activity, unspecified: Secondary | ICD-10-CM | POA: Diagnosis not present

## 2015-05-06 DIAGNOSIS — S83241A Other tear of medial meniscus, current injury, right knee, initial encounter: Secondary | ICD-10-CM | POA: Diagnosis not present

## 2015-05-06 DIAGNOSIS — Y929 Unspecified place or not applicable: Secondary | ICD-10-CM | POA: Diagnosis not present

## 2015-05-14 DIAGNOSIS — M23321 Other meniscus derangements, posterior horn of medial meniscus, right knee: Secondary | ICD-10-CM | POA: Diagnosis not present

## 2015-05-14 DIAGNOSIS — M1711 Unilateral primary osteoarthritis, right knee: Secondary | ICD-10-CM | POA: Diagnosis not present

## 2015-05-14 DIAGNOSIS — S83241D Other tear of medial meniscus, current injury, right knee, subsequent encounter: Secondary | ICD-10-CM | POA: Diagnosis not present

## 2015-05-14 DIAGNOSIS — Z4789 Encounter for other orthopedic aftercare: Secondary | ICD-10-CM | POA: Diagnosis not present

## 2015-05-27 DIAGNOSIS — K760 Fatty (change of) liver, not elsewhere classified: Secondary | ICD-10-CM | POA: Diagnosis not present

## 2015-05-27 DIAGNOSIS — F428 Other obsessive-compulsive disorder: Secondary | ICD-10-CM | POA: Diagnosis not present

## 2015-05-27 DIAGNOSIS — R7301 Impaired fasting glucose: Secondary | ICD-10-CM | POA: Diagnosis not present

## 2015-05-27 DIAGNOSIS — E7800 Pure hypercholesterolemia, unspecified: Secondary | ICD-10-CM | POA: Diagnosis not present

## 2015-05-27 DIAGNOSIS — I10 Essential (primary) hypertension: Secondary | ICD-10-CM | POA: Diagnosis not present

## 2015-05-27 DIAGNOSIS — M2550 Pain in unspecified joint: Secondary | ICD-10-CM | POA: Diagnosis not present

## 2015-06-11 DIAGNOSIS — Z4789 Encounter for other orthopedic aftercare: Secondary | ICD-10-CM | POA: Diagnosis not present

## 2015-06-30 DIAGNOSIS — G8929 Other chronic pain: Secondary | ICD-10-CM | POA: Diagnosis not present

## 2015-06-30 DIAGNOSIS — M791 Myalgia: Secondary | ICD-10-CM | POA: Diagnosis not present

## 2015-09-06 DIAGNOSIS — F428 Other obsessive-compulsive disorder: Secondary | ICD-10-CM | POA: Diagnosis not present

## 2015-09-06 DIAGNOSIS — R799 Abnormal finding of blood chemistry, unspecified: Secondary | ICD-10-CM | POA: Diagnosis not present

## 2015-09-06 DIAGNOSIS — E7800 Pure hypercholesterolemia, unspecified: Secondary | ICD-10-CM | POA: Diagnosis not present

## 2015-09-06 DIAGNOSIS — I10 Essential (primary) hypertension: Secondary | ICD-10-CM | POA: Diagnosis not present

## 2015-09-06 DIAGNOSIS — E669 Obesity, unspecified: Secondary | ICD-10-CM | POA: Diagnosis not present

## 2015-09-06 DIAGNOSIS — Z0000 Encounter for general adult medical examination without abnormal findings: Secondary | ICD-10-CM | POA: Diagnosis not present

## 2015-09-06 DIAGNOSIS — Z23 Encounter for immunization: Secondary | ICD-10-CM | POA: Diagnosis not present

## 2015-09-06 DIAGNOSIS — K219 Gastro-esophageal reflux disease without esophagitis: Secondary | ICD-10-CM | POA: Diagnosis not present

## 2015-09-06 DIAGNOSIS — E119 Type 2 diabetes mellitus without complications: Secondary | ICD-10-CM | POA: Diagnosis not present

## 2015-09-06 DIAGNOSIS — Z6838 Body mass index (BMI) 38.0-38.9, adult: Secondary | ICD-10-CM | POA: Diagnosis not present

## 2015-09-10 DIAGNOSIS — Z4789 Encounter for other orthopedic aftercare: Secondary | ICD-10-CM | POA: Diagnosis not present

## 2015-09-10 DIAGNOSIS — M1711 Unilateral primary osteoarthritis, right knee: Secondary | ICD-10-CM | POA: Diagnosis not present

## 2015-09-10 DIAGNOSIS — S83241D Other tear of medial meniscus, current injury, right knee, subsequent encounter: Secondary | ICD-10-CM | POA: Diagnosis not present

## 2015-09-27 DIAGNOSIS — M1711 Unilateral primary osteoarthritis, right knee: Secondary | ICD-10-CM | POA: Diagnosis not present

## 2015-10-02 DIAGNOSIS — Z23 Encounter for immunization: Secondary | ICD-10-CM | POA: Diagnosis not present

## 2015-10-08 DIAGNOSIS — E119 Type 2 diabetes mellitus without complications: Secondary | ICD-10-CM | POA: Diagnosis not present

## 2015-10-12 DIAGNOSIS — Z1231 Encounter for screening mammogram for malignant neoplasm of breast: Secondary | ICD-10-CM | POA: Diagnosis not present

## 2015-11-09 DIAGNOSIS — M1711 Unilateral primary osteoarthritis, right knee: Secondary | ICD-10-CM | POA: Diagnosis not present

## 2015-11-09 DIAGNOSIS — Z4789 Encounter for other orthopedic aftercare: Secondary | ICD-10-CM | POA: Diagnosis not present

## 2015-11-30 DIAGNOSIS — M1711 Unilateral primary osteoarthritis, right knee: Secondary | ICD-10-CM | POA: Diagnosis not present

## 2015-12-07 DIAGNOSIS — I10 Essential (primary) hypertension: Secondary | ICD-10-CM | POA: Diagnosis not present

## 2015-12-07 DIAGNOSIS — K5900 Constipation, unspecified: Secondary | ICD-10-CM | POA: Diagnosis not present

## 2015-12-07 DIAGNOSIS — E7800 Pure hypercholesterolemia, unspecified: Secondary | ICD-10-CM | POA: Diagnosis not present

## 2015-12-07 DIAGNOSIS — G8929 Other chronic pain: Secondary | ICD-10-CM | POA: Diagnosis not present

## 2015-12-07 DIAGNOSIS — E119 Type 2 diabetes mellitus without complications: Secondary | ICD-10-CM | POA: Diagnosis not present

## 2015-12-07 DIAGNOSIS — F428 Other obsessive-compulsive disorder: Secondary | ICD-10-CM | POA: Diagnosis not present

## 2015-12-08 DIAGNOSIS — M1711 Unilateral primary osteoarthritis, right knee: Secondary | ICD-10-CM | POA: Diagnosis not present

## 2015-12-15 DIAGNOSIS — M1711 Unilateral primary osteoarthritis, right knee: Secondary | ICD-10-CM | POA: Diagnosis not present

## 2015-12-31 DIAGNOSIS — R2681 Unsteadiness on feet: Secondary | ICD-10-CM | POA: Diagnosis not present

## 2015-12-31 DIAGNOSIS — R2689 Other abnormalities of gait and mobility: Secondary | ICD-10-CM | POA: Diagnosis not present

## 2015-12-31 DIAGNOSIS — M6281 Muscle weakness (generalized): Secondary | ICD-10-CM | POA: Diagnosis not present

## 2015-12-31 DIAGNOSIS — M25561 Pain in right knee: Secondary | ICD-10-CM | POA: Diagnosis not present

## 2016-01-04 DIAGNOSIS — M25561 Pain in right knee: Secondary | ICD-10-CM | POA: Diagnosis not present

## 2016-01-04 DIAGNOSIS — R2681 Unsteadiness on feet: Secondary | ICD-10-CM | POA: Diagnosis not present

## 2016-01-04 DIAGNOSIS — R2689 Other abnormalities of gait and mobility: Secondary | ICD-10-CM | POA: Diagnosis not present

## 2016-01-04 DIAGNOSIS — M6281 Muscle weakness (generalized): Secondary | ICD-10-CM | POA: Diagnosis not present

## 2016-01-06 DIAGNOSIS — R2681 Unsteadiness on feet: Secondary | ICD-10-CM | POA: Diagnosis not present

## 2016-01-06 DIAGNOSIS — M25561 Pain in right knee: Secondary | ICD-10-CM | POA: Diagnosis not present

## 2016-01-06 DIAGNOSIS — R2689 Other abnormalities of gait and mobility: Secondary | ICD-10-CM | POA: Diagnosis not present

## 2016-01-06 DIAGNOSIS — M6281 Muscle weakness (generalized): Secondary | ICD-10-CM | POA: Diagnosis not present

## 2016-01-18 DIAGNOSIS — M25561 Pain in right knee: Secondary | ICD-10-CM | POA: Diagnosis not present

## 2016-01-18 DIAGNOSIS — R2681 Unsteadiness on feet: Secondary | ICD-10-CM | POA: Diagnosis not present

## 2016-01-18 DIAGNOSIS — M6281 Muscle weakness (generalized): Secondary | ICD-10-CM | POA: Diagnosis not present

## 2016-01-18 DIAGNOSIS — R2689 Other abnormalities of gait and mobility: Secondary | ICD-10-CM | POA: Diagnosis not present

## 2016-01-20 DIAGNOSIS — R2689 Other abnormalities of gait and mobility: Secondary | ICD-10-CM | POA: Diagnosis not present

## 2016-01-20 DIAGNOSIS — M25561 Pain in right knee: Secondary | ICD-10-CM | POA: Diagnosis not present

## 2016-01-20 DIAGNOSIS — M6281 Muscle weakness (generalized): Secondary | ICD-10-CM | POA: Diagnosis not present

## 2016-01-20 DIAGNOSIS — R0602 Shortness of breath: Secondary | ICD-10-CM | POA: Diagnosis not present

## 2016-01-20 DIAGNOSIS — R2681 Unsteadiness on feet: Secondary | ICD-10-CM | POA: Diagnosis not present

## 2016-01-25 DIAGNOSIS — M25561 Pain in right knee: Secondary | ICD-10-CM | POA: Diagnosis not present

## 2016-01-25 DIAGNOSIS — R2689 Other abnormalities of gait and mobility: Secondary | ICD-10-CM | POA: Diagnosis not present

## 2016-01-25 DIAGNOSIS — M6281 Muscle weakness (generalized): Secondary | ICD-10-CM | POA: Diagnosis not present

## 2016-01-25 DIAGNOSIS — R2681 Unsteadiness on feet: Secondary | ICD-10-CM | POA: Diagnosis not present

## 2016-02-04 DIAGNOSIS — M25561 Pain in right knee: Secondary | ICD-10-CM | POA: Diagnosis not present

## 2016-02-04 DIAGNOSIS — R2689 Other abnormalities of gait and mobility: Secondary | ICD-10-CM | POA: Diagnosis not present

## 2016-02-04 DIAGNOSIS — R2681 Unsteadiness on feet: Secondary | ICD-10-CM | POA: Diagnosis not present

## 2016-02-04 DIAGNOSIS — M6281 Muscle weakness (generalized): Secondary | ICD-10-CM | POA: Diagnosis not present

## 2016-02-10 DIAGNOSIS — M6281 Muscle weakness (generalized): Secondary | ICD-10-CM | POA: Diagnosis not present

## 2016-02-10 DIAGNOSIS — R2681 Unsteadiness on feet: Secondary | ICD-10-CM | POA: Diagnosis not present

## 2016-02-10 DIAGNOSIS — R2689 Other abnormalities of gait and mobility: Secondary | ICD-10-CM | POA: Diagnosis not present

## 2016-02-10 DIAGNOSIS — M25561 Pain in right knee: Secondary | ICD-10-CM | POA: Diagnosis not present

## 2016-02-15 DIAGNOSIS — R2689 Other abnormalities of gait and mobility: Secondary | ICD-10-CM | POA: Diagnosis not present

## 2016-02-15 DIAGNOSIS — M6281 Muscle weakness (generalized): Secondary | ICD-10-CM | POA: Diagnosis not present

## 2016-02-15 DIAGNOSIS — M25561 Pain in right knee: Secondary | ICD-10-CM | POA: Diagnosis not present

## 2016-02-15 DIAGNOSIS — R2681 Unsteadiness on feet: Secondary | ICD-10-CM | POA: Diagnosis not present

## 2016-02-16 DIAGNOSIS — M25561 Pain in right knee: Secondary | ICD-10-CM | POA: Diagnosis not present

## 2016-02-16 DIAGNOSIS — M6281 Muscle weakness (generalized): Secondary | ICD-10-CM | POA: Diagnosis not present

## 2016-02-16 DIAGNOSIS — R2689 Other abnormalities of gait and mobility: Secondary | ICD-10-CM | POA: Diagnosis not present

## 2016-02-16 DIAGNOSIS — R2681 Unsteadiness on feet: Secondary | ICD-10-CM | POA: Diagnosis not present

## 2016-02-24 DIAGNOSIS — M6281 Muscle weakness (generalized): Secondary | ICD-10-CM | POA: Diagnosis not present

## 2016-02-24 DIAGNOSIS — R2681 Unsteadiness on feet: Secondary | ICD-10-CM | POA: Diagnosis not present

## 2016-02-24 DIAGNOSIS — R2689 Other abnormalities of gait and mobility: Secondary | ICD-10-CM | POA: Diagnosis not present

## 2016-02-24 DIAGNOSIS — M25561 Pain in right knee: Secondary | ICD-10-CM | POA: Diagnosis not present

## 2016-02-29 DIAGNOSIS — R2681 Unsteadiness on feet: Secondary | ICD-10-CM | POA: Diagnosis not present

## 2016-02-29 DIAGNOSIS — M6281 Muscle weakness (generalized): Secondary | ICD-10-CM | POA: Diagnosis not present

## 2016-02-29 DIAGNOSIS — M25561 Pain in right knee: Secondary | ICD-10-CM | POA: Diagnosis not present

## 2016-02-29 DIAGNOSIS — R2689 Other abnormalities of gait and mobility: Secondary | ICD-10-CM | POA: Diagnosis not present

## 2016-03-07 DIAGNOSIS — R2242 Localized swelling, mass and lump, left lower limb: Secondary | ICD-10-CM | POA: Diagnosis not present

## 2016-03-07 DIAGNOSIS — M1711 Unilateral primary osteoarthritis, right knee: Secondary | ICD-10-CM | POA: Diagnosis not present

## 2016-03-08 DIAGNOSIS — Z13820 Encounter for screening for osteoporosis: Secondary | ICD-10-CM | POA: Diagnosis not present

## 2016-03-08 DIAGNOSIS — E559 Vitamin D deficiency, unspecified: Secondary | ICD-10-CM | POA: Diagnosis not present

## 2016-03-08 DIAGNOSIS — F428 Other obsessive-compulsive disorder: Secondary | ICD-10-CM | POA: Diagnosis not present

## 2016-03-08 DIAGNOSIS — I10 Essential (primary) hypertension: Secondary | ICD-10-CM | POA: Diagnosis not present

## 2016-03-08 DIAGNOSIS — E7800 Pure hypercholesterolemia, unspecified: Secondary | ICD-10-CM | POA: Diagnosis not present

## 2016-03-08 DIAGNOSIS — E119 Type 2 diabetes mellitus without complications: Secondary | ICD-10-CM | POA: Diagnosis not present

## 2016-03-08 DIAGNOSIS — G8929 Other chronic pain: Secondary | ICD-10-CM | POA: Diagnosis not present

## 2016-03-08 DIAGNOSIS — Z79891 Long term (current) use of opiate analgesic: Secondary | ICD-10-CM | POA: Diagnosis not present

## 2016-03-08 DIAGNOSIS — Z79899 Other long term (current) drug therapy: Secondary | ICD-10-CM | POA: Diagnosis not present

## 2016-03-21 DIAGNOSIS — R2242 Localized swelling, mass and lump, left lower limb: Secondary | ICD-10-CM | POA: Diagnosis not present

## 2016-03-23 DIAGNOSIS — R7301 Impaired fasting glucose: Secondary | ICD-10-CM | POA: Diagnosis not present

## 2016-03-23 DIAGNOSIS — I10 Essential (primary) hypertension: Secondary | ICD-10-CM | POA: Diagnosis not present

## 2016-03-23 DIAGNOSIS — E7800 Pure hypercholesterolemia, unspecified: Secondary | ICD-10-CM | POA: Diagnosis not present

## 2016-03-23 DIAGNOSIS — E559 Vitamin D deficiency, unspecified: Secondary | ICD-10-CM | POA: Diagnosis not present

## 2016-03-27 DIAGNOSIS — R2242 Localized swelling, mass and lump, left lower limb: Secondary | ICD-10-CM | POA: Diagnosis not present

## 2016-05-16 DIAGNOSIS — L988 Other specified disorders of the skin and subcutaneous tissue: Secondary | ICD-10-CM | POA: Diagnosis not present

## 2016-05-16 DIAGNOSIS — H1032 Unspecified acute conjunctivitis, left eye: Secondary | ICD-10-CM | POA: Diagnosis not present

## 2016-05-29 DIAGNOSIS — M1711 Unilateral primary osteoarthritis, right knee: Secondary | ICD-10-CM | POA: Diagnosis not present

## 2016-06-07 DIAGNOSIS — G8929 Other chronic pain: Secondary | ICD-10-CM | POA: Diagnosis not present

## 2016-06-07 DIAGNOSIS — I10 Essential (primary) hypertension: Secondary | ICD-10-CM | POA: Diagnosis not present

## 2016-06-07 DIAGNOSIS — Z79891 Long term (current) use of opiate analgesic: Secondary | ICD-10-CM | POA: Diagnosis not present

## 2016-06-07 DIAGNOSIS — E669 Obesity, unspecified: Secondary | ICD-10-CM | POA: Diagnosis not present

## 2016-06-07 DIAGNOSIS — R7301 Impaired fasting glucose: Secondary | ICD-10-CM | POA: Diagnosis not present

## 2016-06-07 DIAGNOSIS — Z6836 Body mass index (BMI) 36.0-36.9, adult: Secondary | ICD-10-CM | POA: Diagnosis not present

## 2016-06-07 DIAGNOSIS — F428 Other obsessive-compulsive disorder: Secondary | ICD-10-CM | POA: Diagnosis not present

## 2016-06-07 DIAGNOSIS — E7800 Pure hypercholesterolemia, unspecified: Secondary | ICD-10-CM | POA: Diagnosis not present

## 2016-06-07 DIAGNOSIS — Z79899 Other long term (current) drug therapy: Secondary | ICD-10-CM | POA: Diagnosis not present

## 2016-06-07 DIAGNOSIS — E559 Vitamin D deficiency, unspecified: Secondary | ICD-10-CM | POA: Diagnosis not present

## 2016-07-10 DIAGNOSIS — M1711 Unilateral primary osteoarthritis, right knee: Secondary | ICD-10-CM | POA: Diagnosis not present

## 2016-08-17 ENCOUNTER — Telehealth: Payer: Self-pay | Admitting: Cardiology

## 2016-08-17 NOTE — Telephone Encounter (Signed)
error 

## 2016-08-30 DIAGNOSIS — B372 Candidiasis of skin and nail: Secondary | ICD-10-CM | POA: Diagnosis not present

## 2016-08-30 DIAGNOSIS — J3481 Nasal mucositis (ulcerative): Secondary | ICD-10-CM | POA: Diagnosis not present

## 2016-09-07 DIAGNOSIS — H8113 Benign paroxysmal vertigo, bilateral: Secondary | ICD-10-CM | POA: Diagnosis not present

## 2016-09-07 DIAGNOSIS — Z9181 History of falling: Secondary | ICD-10-CM | POA: Diagnosis not present

## 2016-09-07 DIAGNOSIS — R32 Unspecified urinary incontinence: Secondary | ICD-10-CM | POA: Diagnosis not present

## 2016-09-07 DIAGNOSIS — E7800 Pure hypercholesterolemia, unspecified: Secondary | ICD-10-CM | POA: Diagnosis not present

## 2016-09-07 DIAGNOSIS — Z79899 Other long term (current) drug therapy: Secondary | ICD-10-CM | POA: Diagnosis not present

## 2016-09-07 DIAGNOSIS — I10 Essential (primary) hypertension: Secondary | ICD-10-CM | POA: Diagnosis not present

## 2016-09-07 DIAGNOSIS — Z6837 Body mass index (BMI) 37.0-37.9, adult: Secondary | ICD-10-CM | POA: Diagnosis not present

## 2016-09-07 DIAGNOSIS — Z90710 Acquired absence of both cervix and uterus: Secondary | ICD-10-CM | POA: Diagnosis not present

## 2016-09-07 DIAGNOSIS — Z86010 Personal history of colonic polyps: Secondary | ICD-10-CM | POA: Diagnosis not present

## 2016-09-07 DIAGNOSIS — E119 Type 2 diabetes mellitus without complications: Secondary | ICD-10-CM | POA: Diagnosis not present

## 2016-09-07 DIAGNOSIS — Z0000 Encounter for general adult medical examination without abnormal findings: Secondary | ICD-10-CM | POA: Diagnosis not present

## 2016-11-15 DIAGNOSIS — Z23 Encounter for immunization: Secondary | ICD-10-CM | POA: Diagnosis not present

## 2016-11-15 DIAGNOSIS — M79672 Pain in left foot: Secondary | ICD-10-CM | POA: Diagnosis not present

## 2016-12-13 DIAGNOSIS — F418 Other specified anxiety disorders: Secondary | ICD-10-CM | POA: Diagnosis not present

## 2016-12-13 DIAGNOSIS — E7800 Pure hypercholesterolemia, unspecified: Secondary | ICD-10-CM | POA: Diagnosis not present

## 2016-12-13 DIAGNOSIS — R51 Headache: Secondary | ICD-10-CM | POA: Diagnosis not present

## 2017-03-12 DIAGNOSIS — E7800 Pure hypercholesterolemia, unspecified: Secondary | ICD-10-CM | POA: Diagnosis not present

## 2017-03-12 DIAGNOSIS — I10 Essential (primary) hypertension: Secondary | ICD-10-CM | POA: Diagnosis not present

## 2017-03-12 DIAGNOSIS — R7301 Impaired fasting glucose: Secondary | ICD-10-CM | POA: Diagnosis not present

## 2017-03-12 DIAGNOSIS — G8929 Other chronic pain: Secondary | ICD-10-CM | POA: Diagnosis not present

## 2017-03-12 DIAGNOSIS — Z79899 Other long term (current) drug therapy: Secondary | ICD-10-CM | POA: Diagnosis not present

## 2017-03-13 DIAGNOSIS — Z79899 Other long term (current) drug therapy: Secondary | ICD-10-CM | POA: Diagnosis not present

## 2017-03-13 DIAGNOSIS — I10 Essential (primary) hypertension: Secondary | ICD-10-CM | POA: Diagnosis not present

## 2017-03-13 DIAGNOSIS — R7301 Impaired fasting glucose: Secondary | ICD-10-CM | POA: Diagnosis not present

## 2017-03-13 DIAGNOSIS — E7800 Pure hypercholesterolemia, unspecified: Secondary | ICD-10-CM | POA: Diagnosis not present

## 2017-05-17 DIAGNOSIS — M1711 Unilateral primary osteoarthritis, right knee: Secondary | ICD-10-CM | POA: Diagnosis not present

## 2017-05-21 DIAGNOSIS — I10 Essential (primary) hypertension: Secondary | ICD-10-CM | POA: Diagnosis not present

## 2017-05-21 DIAGNOSIS — Z0289 Encounter for other administrative examinations: Secondary | ICD-10-CM | POA: Diagnosis not present

## 2017-05-21 DIAGNOSIS — E119 Type 2 diabetes mellitus without complications: Secondary | ICD-10-CM | POA: Diagnosis not present

## 2017-05-21 DIAGNOSIS — Z6839 Body mass index (BMI) 39.0-39.9, adult: Secondary | ICD-10-CM | POA: Diagnosis not present

## 2017-05-21 DIAGNOSIS — M1711 Unilateral primary osteoarthritis, right knee: Secondary | ICD-10-CM | POA: Diagnosis not present

## 2017-05-22 ENCOUNTER — Other Ambulatory Visit: Payer: Self-pay

## 2017-05-22 DIAGNOSIS — M25561 Pain in right knee: Secondary | ICD-10-CM | POA: Insufficient documentation

## 2017-05-22 DIAGNOSIS — Z6839 Body mass index (BMI) 39.0-39.9, adult: Secondary | ICD-10-CM

## 2017-05-22 DIAGNOSIS — I10 Essential (primary) hypertension: Secondary | ICD-10-CM | POA: Insufficient documentation

## 2017-06-07 ENCOUNTER — Ambulatory Visit (INDEPENDENT_AMBULATORY_CARE_PROVIDER_SITE_OTHER): Payer: PPO | Admitting: Cardiology

## 2017-06-07 ENCOUNTER — Encounter: Payer: Self-pay | Admitting: Cardiology

## 2017-06-07 DIAGNOSIS — E119 Type 2 diabetes mellitus without complications: Secondary | ICD-10-CM | POA: Diagnosis not present

## 2017-06-07 DIAGNOSIS — I10 Essential (primary) hypertension: Secondary | ICD-10-CM | POA: Diagnosis not present

## 2017-06-07 DIAGNOSIS — E782 Mixed hyperlipidemia: Secondary | ICD-10-CM | POA: Diagnosis not present

## 2017-06-07 DIAGNOSIS — O24919 Unspecified diabetes mellitus in pregnancy, unspecified trimester: Secondary | ICD-10-CM

## 2017-06-07 DIAGNOSIS — Z01810 Encounter for preprocedural cardiovascular examination: Secondary | ICD-10-CM | POA: Insufficient documentation

## 2017-06-07 NOTE — Patient Instructions (Addendum)
Medication Instructions:  Your physician recommends that you continue on your current medications as directed. Please refer to the Current Medication list given to you today.  Labwork: None  Testing/Procedures: Your physician has requested that you have a lexiscan myoview. For further information please visit [email or web address] Please follow instruction sheet, as given.  Your physician has requested that you have an echocardiogram. Echocardiography is a painless test that uses sound waves to create images of your heart. It provides your doctor with information about the size and shape of your heart and how well your heart's chambers and valves are working. This procedure takes approximately one hour. There are no restrictions for this procedure.  Follow-Up: Your physician recommends that you schedule a follow-up appointment in: as needed.  Any Other Special Instructions Will Be Listed Below (If Applicable).     If you need a refill on your cardiac medications before your next appointment, please call your pharmacy.   [facility]  Garey Ham, RN, BSN  Echocardiogram An echocardiogram, or echocardiography, uses sound waves (ultrasound) to produce an image of your heart. The echocardiogram is simple, painless, obtained within a short period of time, and offers valuable information to your health care provider. The images from an echocardiogram can provide information such as:  Evidence of coronary artery disease (CAD).  Heart size.  Heart muscle function.  Heart valve function.  Aneurysm detection.  Evidence of a past heart attack.  Fluid buildup around the heart.  Heart muscle thickening.  Assess heart valve function.  Tell a health care provider about:  Any allergies you have.  All medicines you are taking, including vitamins, herbs, eye drops, creams, and over-the-counter medicines.  Any problems you or family members have had with anesthetic medicines.  Any  blood disorders you have.  Any surgeries you have had.  Any medical conditions you have.  Whether you are pregnant or may be pregnant. What happens before the procedure? No special preparation is needed. Eat and drink normally. What happens during the procedure?  In order to produce an image of your heart, gel will be applied to your chest and a wand-like tool (transducer) will be moved over your chest. The gel will help transmit the sound waves from the transducer. The sound waves will harmlessly bounce off your heart to allow the heart images to be captured in real-time motion. These images will then be recorded.  You may need an IV to receive a medicine that improves the quality of the pictures. What happens after the procedure? You may return to your normal schedule including diet, activities, and medicines, unless your health care provider tells you otherwise. This information is not intended to replace advice given to you by your health care provider. Make sure you discuss any questions you have with your health care provider. Document Released: 12/24/1999 Document Revised: 08/14/2015 Document Reviewed: 09/02/2012 Elsevier Interactive Patient Education  2017 [employer].

## 2017-06-07 NOTE — Progress Notes (Signed)
Cardiology Office Note:    Date:  06/07/2017   ID:  Carolyn Arnold, DOB 1955/06/03, MRN [id number]  PCP:  Alinda Deem, MD  Cardiologist:  Garwin Brothers, MD   Referring MD: Alinda Deem, MD    ASSESSMENT:    1. Essential hypertension   2. Diabetes mellitus affecting pregnancy, unspecified trimester   3. Mixed dyslipidemia   4. Pre-operative cardiovascular examination    PLAN:    In order of problems listed above:  1. Primary prevention stressed with the patient.  Importance of compliance with diet and medications stressed and she vocalized understanding.  Her blood pressure is stable.  Diet was discussed for dyslipidemia and diabetes mellitus. 2. I explained to her the process of preop risk stratification.  She has multiple risk factors for coronary artery disease and therefore I would prefer to do a pharmacological nuclear stress test 100 and she is agreeable.  If this is negative then she is not at high risk for coronary events during the aforementioned surgery.  Meticulous hemodynamic monitoring and continued perioperative beta-blockade will further reduce the risk of coronary events. 3. She gives history of mitral valve prolapse in the remote past and we will have an echocardiogram to assess this.  Clinically I could not assess any click on my evaluation. 4. She will be seen in follow-up appointment on a as needed basis only.   Medication Adjustments/Labs and Tests Ordered: Current medicines are reviewed at length with the patient today.  Concerns regarding medicines are outlined above.  No orders of the defined types were placed in this encounter.  No orders of the defined types were placed in this encounter.    History of Present Illness:    Carolyn Arnold is a 62 y.o. female who is being seen today for the evaluation of preop cardiovascular evaluation at the request of Alinda Deem, MD.  Patient is a pleasant 62 year old female.  She has past medical history of  bronchial asthma, essential hypertension and diet-controlled diabetes mellitus.  She also gives history of dyslipidemia.  She is planning to undergo right knee replacement surgery.  Obviously she leads a sedentary lifestyle.  She occasionally complains of chest tightness with no radiation to any part of the body.  No orthopnea or PND.  For her evaluation for cardiac status before surgery she is here.  At the time of my evaluation, the patient is alert awake oriented and in no distress.  Past Medical History:  Diagnosis Date  . ALLERGIC RHINITIS 07/20/2009   Qualifier: Diagnosis of  By: Delton Coombes MD, Les Pou   . Carpal tunnel syndrome   . Chronic lower back pain   . Diet-controlled type 2 diabetes mellitus (HCC)   . FATTY LIVER DISEASE, HX OF 10/29/2008   Qualifier: Diagnosis of  By: Denyse Amass CMA, Carol    . Fibromyalgia   . GERD (gastroesophageal reflux disease)   . Gout   . Hyperlipidemia   . IBS (irritable bowel syndrome)   . Intermittent constipation   . Mitral valve prolapse   . Pancreatitis   . Pericardial effusion    Chronic  . Renal stones     Past Surgical History:  Procedure Laterality Date  . CHOLECYSTECTOMY  1993  . ELBOW FRACTURE SURGERY  1973  . KNEE ARTHROSCOPY Right 2017  . LUMBAR LAMINECTOMY  1998 & 2008  . right shoulder clavical reduction for arthritis  2016  . SHOULDER SURGERY Left    by Dr. Thedore Mins for bone spurs  .  TONSILLECTOMY AND ADENOIDECTOMY    . TOTAL ABDOMINAL HYSTERECTOMY W/ BILATERAL SALPINGOOPHORECTOMY  1988   for endometriosis    Current Medications: Current Meds  Medication Sig  . Albuterol Sulfate (VENTOLIN HFA IN) Inhale 2 puffs into the lungs every 4 (four) hours as needed.  Marland Kitchen allopurinol (ZYLOPRIM) 300 MG tablet Take 300 mg by mouth daily.  Marland Kitchen atenolol (TENORMIN) 50 MG tablet Take 50 mg by mouth daily.  . cyclobenzaprine (FLEXERIL) 10 MG tablet Take 10 mg by mouth 3 (three) times daily as needed. For back pain  . diazepam (VALIUM) 5 MG tablet  Take 5 mg by mouth 2 (two) times daily.  Marland Kitchen esomeprazole (NEXIUM) 40 MG capsule Take 40 mg by mouth daily at 12 noon.  . fluticasone (FLONASE) 50 MCG/ACT nasal spray Place 2 sprays into both nostrils daily.  Marland Kitchen gabapentin (NEURONTIN) 300 MG capsule Take 3 capsules by mouth daily. Up to three capsules twice daily  . gentamicin ointment (GARAMYCIN) 0.1 % Apply 1 application topically 3 (three) times daily.  . hyoscyamine (LEVSIN SL) 0.125 MG SL tablet Place 0.125 mg under the tongue 3 (three) times daily as needed. Abdominal pain  . ibuprofen (ADVIL,MOTRIN) 600 MG tablet Take 600 mg by mouth every 6 (six) hours as needed.  . meclizine (ANTIVERT) 25 MG tablet Take 25 mg by mouth every 6 (six) hours as needed. For dizziness  . Multiple Vitamin (MULTIVITAMIN) tablet Take 1 tablet by mouth daily.  . Oxycodone HCl 10 MG TABS Take 1 tablet by mouth every 6 (six) hours as needed.  . polyethylene glycol (MIRALAX / GLYCOLAX) packet Take 17 g by mouth daily.  . pravastatin (PRAVACHOL) 40 MG tablet Take 40 mg by mouth at bedtime.  . sertraline (ZOLOFT) 50 MG tablet Take 50 mg by mouth daily.  Marland Kitchen triamcinolone cream (KENALOG) 0.1 % Apply 1 application topically 2 (two) times daily. For itching and rash  . [DISCONTINUED] aspirin 325 MG tablet Take 325 mg by mouth daily.     Allergies:   Ambien [zolpidem tartrate]; Sulfonamide derivatives; Welchol [colesevelam hcl]; Celecoxib; Rofecoxib; Zolpidem tartrate; Cox-2 inhibitors; Cymbalta [duloxetine hcl]; Mobic [meloxicam]; Trazodone and nefazodone; and Zyprexa [olanzapine]   Social History   Socioeconomic History  . Marital status: Married    Spouse name: Not on file  . Number of children: Not on file  . Years of education: Not on file  . Highest education level: Not on file  Occupational History  . Not on file  Social Needs  . Financial resource strain: Not on file  . Food insecurity:    Worry: Not on file    Inability: Not on file  . Transportation  needs:    Medical: Not on file    Non-medical: Not on file  Tobacco Use  . Smoking status: Never Smoker  . Smokeless tobacco: Never Used  Substance and Sexual Activity  . Alcohol use: Never    Frequency: Never  . Drug use: Not on file  . Sexual activity: Not on file  Lifestyle  . Physical activity:    Days per week: Not on file    Minutes per session: Not on file  . Stress: Not on file  Relationships  . Social connections:    Talks on phone: Not on file    Gets together: Not on file    Attends religious service: Not on file    Active member of club or organization: Not on file    Attends meetings of clubs or  organizations: Not on file    Relationship status: Not on file  Other Topics Concern  . Not on file  Social History Narrative  . Not on file     Family History: The patient's family history includes Breast cancer in her maternal aunt, mother, and sister; COPD in her father and mother; Colon cancer in her mother; Coronary artery disease in her brother and mother; Diabetes in her sister; Hyperlipidemia in her brother and sister; Hypertension in her sister; Lung cancer in her mother.  ROS:   Please see the history of present illness.    All other systems reviewed and are negative.  EKGs/Labs/Other Studies Reviewed:    The following studies were reviewed today: EKG reveals sinus rhythm and nonspecific ST-T changes.   Recent Labs: No results found for requested labs within last 8760 hours.  Recent Lipid Panel    Component Value Date/Time   CHOL 239 (HH) 06/06/2007 1532   TRIG 101 06/06/2007 1532   HDL 32.0 (L) 06/06/2007 1532   CHOLHDL 7.5 CALC 06/06/2007 1532   VLDL 20 06/06/2007 1532   LDLDIRECT 186.2 06/06/2007 1532    Physical Exam:    VS:  BP 118/72 (BP Location: Right Arm, Patient Position: Sitting, Cuff Size: Normal)   Pulse 88   Ht  (1.651 m)   Wt 221 lb (100.2 kg)   SpO2 98%   BMI 36.78 kg/m     Wt Readings from Last 3 Encounters:    06/07/17 221 lb (100.2 kg)     GEN: Patient is in no acute distress HEENT: Normal NECK: No JVD; No carotid bruits LYMPHATICS: No lymphadenopathy CARDIAC: S1 S2 regular, 2/6 systolic murmur at the apex. RESPIRATORY:  Clear to auscultation without rales, wheezing or rhonchi  ABDOMEN: Soft, non-tender, non-distended MUSCULOSKELETAL:  No edema; No deformity  SKIN: Warm and dry NEUROLOGIC:  Alert and oriented x 3 PSYCHIATRIC:  Normal affect    Signed, Garwin Brothers, MD  06/07/2017 9:55 AM    [facility]

## 2017-06-11 ENCOUNTER — Telehealth (HOSPITAL_COMMUNITY): Payer: Self-pay | Admitting: *Deleted

## 2017-06-11 NOTE — Telephone Encounter (Signed)
Patient given detailed instructions per Myocardial Perfusion Study Information Sheet for the test on 06/14/17. Patient notified to arrive 15 minutes early and that it is imperative to arrive on time for appointment to keep from having the test rescheduled.  If you need to cancel or reschedule your appointment, please call the office within 24 hours of your appointment. . Patient verbalized understanding.Carolyn Arnold [name]    

## 2017-06-14 ENCOUNTER — Ambulatory Visit (HOSPITAL_COMMUNITY): Payer: PPO | Attending: Cardiology

## 2017-06-14 ENCOUNTER — Other Ambulatory Visit: Payer: Self-pay

## 2017-06-14 ENCOUNTER — Ambulatory Visit (HOSPITAL_BASED_OUTPATIENT_CLINIC_OR_DEPARTMENT_OTHER): Payer: PPO

## 2017-06-14 VITALS — Ht 65.0 in | Wt 221.0 lb

## 2017-06-14 DIAGNOSIS — E782 Mixed hyperlipidemia: Secondary | ICD-10-CM | POA: Diagnosis not present

## 2017-06-14 DIAGNOSIS — I10 Essential (primary) hypertension: Secondary | ICD-10-CM

## 2017-06-14 DIAGNOSIS — I340 Nonrheumatic mitral (valve) insufficiency: Secondary | ICD-10-CM | POA: Insufficient documentation

## 2017-06-14 DIAGNOSIS — Z01810 Encounter for preprocedural cardiovascular examination: Secondary | ICD-10-CM

## 2017-06-14 DIAGNOSIS — I119 Hypertensive heart disease without heart failure: Secondary | ICD-10-CM | POA: Diagnosis not present

## 2017-06-14 DIAGNOSIS — R0602 Shortness of breath: Secondary | ICD-10-CM

## 2017-06-14 DIAGNOSIS — Z01818 Encounter for other preprocedural examination: Secondary | ICD-10-CM | POA: Insufficient documentation

## 2017-06-14 DIAGNOSIS — R079 Chest pain, unspecified: Secondary | ICD-10-CM | POA: Insufficient documentation

## 2017-06-14 DIAGNOSIS — O24919 Unspecified diabetes mellitus in pregnancy, unspecified trimester: Secondary | ICD-10-CM

## 2017-06-14 LAB — ECHOCARDIOGRAM COMPLETE
HEIGHTINCHES: 65 in
WEIGHTICAEL: 3536 [oz_av]

## 2017-06-14 MED ORDER — PERFLUTREN LIPID MICROSPHERE
1.0000 mL | INTRAVENOUS | Status: AC | PRN
  Administered 2017-06-14: 2 mL via INTRAVENOUS

## 2017-06-14 MED ORDER — TECHNETIUM TC 99M TETROFOSMIN IV KIT
32.9000 | PACK | Freq: Once | INTRAVENOUS | Status: AC | PRN
  Administered 2017-06-14: 32.9 via INTRAVENOUS
  Filled 2017-06-14: qty 33

## 2017-06-14 MED ORDER — REGADENOSON 0.4 MG/5ML IV SOLN
0.4000 mg | Freq: Once | INTRAVENOUS | Status: AC
  Administered 2017-06-14: 0.4 mg via INTRAVENOUS

## 2017-06-15 ENCOUNTER — Telehealth: Payer: Self-pay

## 2017-06-15 NOTE — Telephone Encounter (Signed)
-----   Message from Garwin Brothersajan R Revankar, MD sent at 06/15/2017  8:30 AM EDT ----- The results of the study is unremarkable. Please inform patient. I will discuss in detail at next appointment. Cc  primary care/referring physician Garwin Brothersajan R Revankar, MD 06/15/2017 8:30 AM

## 2017-06-15 NOTE — Telephone Encounter (Signed)
Left message to return call on home number. NO answer on mobile number, no voicemail. Will inform of results of echocardiogram when call returned.

## 2017-06-15 NOTE — Telephone Encounter (Signed)
Patient informed of results of echocardiogram. No further questions.

## 2017-06-18 ENCOUNTER — Ambulatory Visit (HOSPITAL_COMMUNITY): Payer: PPO | Attending: Cardiology

## 2017-06-18 LAB — MYOCARDIAL PERFUSION IMAGING
CHL CUP NUCLEAR SDS: 1
CHL CUP NUCLEAR SRS: 10
CSEPPHR: 88 {beats}/min
LV dias vol: 84 mL (ref 46–106)
LV sys vol: 31 mL
NUC STRESS TID: 0.93
RATE: 0.45
Rest HR: 72 {beats}/min
SSS: 11

## 2017-06-18 MED ORDER — TECHNETIUM TC 99M TETROFOSMIN IV KIT
32.1000 | PACK | Freq: Once | INTRAVENOUS | Status: AC | PRN
  Administered 2017-06-18: 32.1 via INTRAVENOUS
  Filled 2017-06-18: qty 33

## 2017-07-19 DIAGNOSIS — F428 Other obsessive-compulsive disorder: Secondary | ICD-10-CM | POA: Diagnosis not present

## 2017-07-19 DIAGNOSIS — Z0000 Encounter for general adult medical examination without abnormal findings: Secondary | ICD-10-CM | POA: Diagnosis not present

## 2017-07-19 DIAGNOSIS — E119 Type 2 diabetes mellitus without complications: Secondary | ICD-10-CM | POA: Diagnosis not present

## 2017-07-19 DIAGNOSIS — Z6838 Body mass index (BMI) 38.0-38.9, adult: Secondary | ICD-10-CM | POA: Diagnosis not present

## 2017-07-19 DIAGNOSIS — I10 Essential (primary) hypertension: Secondary | ICD-10-CM | POA: Diagnosis not present

## 2017-07-19 DIAGNOSIS — Z86010 Personal history of colonic polyps: Secondary | ICD-10-CM | POA: Diagnosis not present

## 2017-07-19 DIAGNOSIS — K219 Gastro-esophageal reflux disease without esophagitis: Secondary | ICD-10-CM | POA: Diagnosis not present

## 2017-07-19 DIAGNOSIS — E7800 Pure hypercholesterolemia, unspecified: Secondary | ICD-10-CM | POA: Diagnosis not present

## 2017-07-19 DIAGNOSIS — Z90710 Acquired absence of both cervix and uterus: Secondary | ICD-10-CM | POA: Diagnosis not present

## 2017-07-19 DIAGNOSIS — M1711 Unilateral primary osteoarthritis, right knee: Secondary | ICD-10-CM | POA: Diagnosis not present

## 2017-07-19 DIAGNOSIS — K760 Fatty (change of) liver, not elsewhere classified: Secondary | ICD-10-CM | POA: Diagnosis not present

## 2017-08-06 ENCOUNTER — Other Ambulatory Visit: Payer: Self-pay | Admitting: Orthopedic Surgery

## 2017-08-08 DIAGNOSIS — J3489 Other specified disorders of nose and nasal sinuses: Secondary | ICD-10-CM | POA: Diagnosis not present

## 2017-08-08 DIAGNOSIS — T162XXS Foreign body in left ear, sequela: Secondary | ICD-10-CM | POA: Diagnosis not present

## 2017-08-08 DIAGNOSIS — J342 Deviated nasal septum: Secondary | ICD-10-CM | POA: Diagnosis not present

## 2017-08-16 ENCOUNTER — Other Ambulatory Visit (HOSPITAL_COMMUNITY): Payer: Self-pay | Admitting: *Deleted

## 2017-08-16 NOTE — Progress Notes (Signed)
LOV DR [id number] 06-07-17 CARDIOLOGY Epic EKG 06-07-17 Epic ECHO 06-14-17 Epic

## 2017-08-16 NOTE — Patient Instructions (Addendum)
Carolyn FallenSandra F Carrero  08/16/2017   Your procedure is scheduled on: 08-27-17  [facility]  Report to admitting at 700 AM    Call this number if you have problems the morning of surgery [phone]   Remember: Do not eat food or drink liquids :After Midnight.     Take these medicines the morning of surgery with A SIP OF WATER: OXYCODONE IF NEEDED, DIAZEPAM (VALIUM) IF NEEDED, ALBUTEROL (PROVENTIL) INHALER IF NEEDED AND BRING INHALER WITH YOU, GABAPENTIN (NEURONTIN), FLONASE NASAL SPRAY, EYE DROPS IS NEEDED FLEXERIL IF NEEDED                               You may not have any metal on your body including hair pins and              piercings  Do not wear jewelry, make-up, lotions, powders or perfumes, deodorant             Do not wear nail polish.  Do not shave  48 hours prior to surgery.              Men may shave face and neck.   Do not bring valuables to the hospital. [facility] IS NOT             RESPONSIBLE   FOR VALUABLES.  Contacts, dentures or bridgework may not be worn into surgery.  Leave suitcase in the car. After surgery it may be brought to your room.                 Please read over the following fact sheets you were given: _____________________________________________________________________             [facility] Pre[facility] Before surgery, you can play an important role.  Because skin is not sterile, your skin needs to be as free of germs as possible.  You can reduce the number of germs on your skin by washing with CHG (chlorahexidine gluconate) soap before surgery.  CHG is an antiseptic cleaner which kills germs and bonds with the skin to continue killing germs even after washing. Please DO NOT use if you have an allergy to CHG or antibacterial soaps.  If your skin becomes reddened/irritated stop using the CHG and inform your nurse when you arrive at Short Stay. Do not shave (including legs and underarms) for at  least 48 hours prior to the first CHG shower.  You may shave your face/neck. Please follow these instructions carefully:  1.  Shower with CHG Soap the night before surgery and the  morning of Surgery.  2.  If you choose to wash your hair, wash your hair first as usual with your  normal  shampoo.  3.  After you shampoo, rinse your hair and body thoroughly to remove the  shampoo.                           4.  Use CHG as you would any other liquid soap.  You can apply chg directly  to the skin and wash                       Gently with a scrungie or clean washcloth.  5.  Apply the CHG  Soap to your body ONLY FROM THE NECK DOWN.   Do not use on face/ open                           Wound or open sores. Avoid contact with eyes, ears mouth and genitals (private parts).                       Wash face,  Genitals (private parts) with your normal soap.             6.  Wash thoroughly, paying special attention to the area where your surgery  will be performed.  7.  Thoroughly rinse your body with warm water from the neck down.  8.  DO NOT shower/wash with your normal soap after using and rinsing off  the CHG Soap.                9.  Pat yourself dry with a clean towel.            10.  Wear clean pajamas.            11.  Place clean sheets on your bed the night of your first shower and do not  sleep with pets. Day of Surgery : Do not apply any lotions/deodorants the morning of surgery.  Please wear clean clothes to the hospital/surgery center.  FAILURE TO FOLLOW THESE INSTRUCTIONS MAY RESULT IN THE CANCELLATION OF YOUR SURGERY PATIENT SIGNATURE_________________________________  NURSE SIGNATURE__________________________________  ________________________________________________________________________   Adam Phenix  An incentive spirometer is a tool that can help keep your lungs clear and active. This tool measures how well you are filling your lungs with each breath. Taking long deep breaths  may help reverse or decrease the chance of developing breathing (pulmonary) problems (especially infection) following:  A long period of time when you are unable to move or be active. BEFORE THE PROCEDURE   If the spirometer includes an indicator to show your best effort, your nurse or respiratory therapist will set it to a desired goal.  If possible, sit up straight or lean slightly forward. Try not to slouch.  Hold the incentive spirometer in an upright position. INSTRUCTIONS FOR USE  1. Sit on the edge of your bed if possible, or sit up as far as you can in bed or on a chair. 2. Hold the incentive spirometer in an upright position. 3. Breathe out normally. 4. Place the mouthpiece in your mouth and seal your lips tightly around it. 5. Breathe in slowly and as deeply as possible, raising the piston or the ball toward the top of the column. 6. Hold your breath for 3-5 seconds or for as long as possible. Allow the piston or ball to fall to the bottom of the column. 7. Remove the mouthpiece from your mouth and breathe out normally. 8. Rest for a few seconds and repeat Steps 1 through 7 at least 10 times every 1-2 hours when you are awake. Take your time and take a few normal breaths between deep breaths. 9. The spirometer may include an indicator to show your best effort. Use the indicator as a goal to work toward during each repetition. 10. After each set of 10 deep breaths, practice coughing to be sure your lungs are clear. If you have an incision (the cut made at the time of surgery), support your incision when coughing by placing a pillow or rolled up towels  firmly against it. Once you are able to get out of bed, walk around indoors and cough well. You may stop using the incentive spirometer when instructed by your caregiver.  RISKS AND COMPLICATIONS  Take your time so you do not get dizzy or light-headed.  If you are in pain, you may need to take or ask for pain medication before doing  incentive spirometry. It is harder to take a deep breath if you are having pain. AFTER USE  Rest and breathe slowly and easily.  It can be helpful to keep track of a log of your progress. Your caregiver can provide you with a simple table to help with this. If you are using the spirometer at home, follow these instructions: [place] IF:   You are having difficultly using the spirometer.  You have trouble using the spirometer as often as instructed.  Your pain medication is not giving enough relief while using the spirometer.  You develop fever of 100.5 F (38.1 C) or higher. SEEK IMMEDIATE MEDICAL CARE IF:   You cough up bloody sputum that had not been present before.  You develop fever of 102 F (38.9 C) or greater.  You develop worsening pain at or near the incision site. MAKE SURE YOU:   Understand these instructions.  Will watch your condition.  Will get help right away if you are not doing well or get worse. Document Released: 05/08/2006 Document Revised: 03/20/2011 Document Reviewed: 07/09/2006 [facility] Patient Information 2014 [place], [place].   ________________________________________________________________________

## 2017-08-20 ENCOUNTER — Encounter (HOSPITAL_COMMUNITY)
Admission: RE | Admit: 2017-08-20 | Discharge: 2017-08-20 | Disposition: A | Discharge status: Home / Self Care | Payer: PPO | Source: Ambulatory Visit | Attending: Orthopedic Surgery | Admitting: Orthopedic Surgery

## 2017-08-20 ENCOUNTER — Other Ambulatory Visit: Payer: Self-pay

## 2017-08-20 ENCOUNTER — Encounter (HOSPITAL_COMMUNITY): Payer: Self-pay

## 2017-08-20 DIAGNOSIS — Z01812 Encounter for preprocedural laboratory examination: Secondary | ICD-10-CM | POA: Diagnosis not present

## 2017-08-20 DIAGNOSIS — M1711 Unilateral primary osteoarthritis, right knee: Secondary | ICD-10-CM | POA: Diagnosis not present

## 2017-08-20 HISTORY — DX: Unspecified asthma, uncomplicated: J45.909

## 2017-08-20 HISTORY — DX: Nonrheumatic mitral (valve) prolapse: I34.1

## 2017-08-20 HISTORY — DX: Family history of other specified conditions: Z84.89

## 2017-08-20 HISTORY — DX: Essential (primary) hypertension: I10

## 2017-08-20 HISTORY — DX: Calcium deposit in bursa, left elbow: M71.422

## 2017-08-20 HISTORY — DX: Cardiac arrhythmia, unspecified: I49.9

## 2017-08-20 LAB — CBC WITH DIFFERENTIAL/PLATELET
BASOS PCT: 1 %
Basophils Absolute: 0 10*3/uL (ref 0.0–0.1)
EOS ABS: 0.3 10*3/uL (ref 0.0–0.7)
EOS PCT: 4 %
HCT: 40.3 % (ref 36.0–46.0)
Hemoglobin: 13.3 g/dL (ref 12.0–15.0)
LYMPHS ABS: 2.4 10*3/uL (ref 0.7–4.0)
Lymphocytes Relative: 36 %
MCH: 30.3 pg (ref 26.0–34.0)
MCHC: 33 g/dL (ref 30.0–36.0)
MCV: 91.8 fL (ref 78.0–100.0)
Monocytes Absolute: 0.5 10*3/uL (ref 0.1–1.0)
Monocytes Relative: 7 %
Neutro Abs: 3.5 10*3/uL (ref 1.7–7.7)
Neutrophils Relative %: 52 %
PLATELETS: 186 10*3/uL (ref 150–400)
RBC: 4.39 MIL/uL (ref 3.87–5.11)
RDW: 14 % (ref 11.5–15.5)
WBC: 6.7 10*3/uL (ref 4.0–10.5)

## 2017-08-20 LAB — COMPREHENSIVE METABOLIC PANEL
ALT: 48 U/L — ABNORMAL HIGH (ref 0–44)
AST: 68 U/L — ABNORMAL HIGH (ref 15–41)
Albumin: 4.2 g/dL (ref 3.5–5.0)
Alkaline Phosphatase: 124 U/L (ref 38–126)
Anion gap: 7 (ref 5–15)
BUN: 15 mg/dL (ref 8–23)
CO2: 30 mmol/L (ref 22–32)
Calcium: 9.6 mg/dL (ref 8.9–10.3)
Chloride: 105 mmol/L (ref 98–111)
Creatinine, Ser: 0.85 mg/dL (ref 0.44–1.00)
GFR calc Af Amer: >60 mL/min (ref >60)
GFR calc non Af Amer: >60 mL/min (ref >60)
Glucose, Bld: 108 mg/dL — ABNORMAL HIGH (ref 70–99)
Potassium: 4.9 mmol/L (ref 3.5–5.1)
Sodium: 142 mmol/L (ref 135–145)
Total Bilirubin: 0.7 mg/dL (ref 0.3–1.2)
Total Protein: 7.6 g/dL (ref 6.5–8.1)

## 2017-08-20 LAB — SURGICAL PCR SCREEN
MRSA, PCR: NEGATIVE
Staphylococcus aureus: POSITIVE — AB

## 2017-08-20 LAB — GLUCOSE, CAPILLARY: GLUCOSE-CAPILLARY: 106 mg/dL — AB (ref 70–99)

## 2017-08-21 LAB — HEMOGLOBIN A1C
Hgb A1c MFr Bld: 6.4 % — ABNORMAL HIGH (ref 4.8–5.6)
MEAN PLASMA GLUCOSE: 137 mg/dL

## 2017-08-23 DIAGNOSIS — J069 Acute upper respiratory infection, unspecified: Secondary | ICD-10-CM | POA: Diagnosis not present

## 2017-08-24 NOTE — Progress Notes (Signed)
Called patient and made aware arrive 930 am 08-27-17 [facility] admitting, all other instructions given at pre op are to be followed.

## 2017-08-26 MED ORDER — BUPIVACAINE LIPOSOME 1.3 % IJ SUSP
20.0000 mL | INTRAMUSCULAR | Status: DC
  Filled 2017-08-26: qty 20

## 2017-08-27 ENCOUNTER — Ambulatory Visit (HOSPITAL_COMMUNITY): Payer: PPO | Admitting: Anesthesiology

## 2017-08-27 ENCOUNTER — Encounter (HOSPITAL_COMMUNITY): Payer: Self-pay | Admitting: Emergency Medicine

## 2017-08-27 ENCOUNTER — Ambulatory Visit (HOSPITAL_COMMUNITY)
Admission: RE | Admit: 2017-08-27 | Discharge: 2017-08-27 | Disposition: A | Discharge status: Home / Self Care | Payer: PPO | Source: Ambulatory Visit | Attending: Orthopedic Surgery | Admitting: Orthopedic Surgery

## 2017-08-27 ENCOUNTER — Encounter (HOSPITAL_COMMUNITY)
Admission: RE | Disposition: A | Discharge status: Home / Self Care | Payer: Self-pay | Source: Ambulatory Visit | Attending: Orthopedic Surgery

## 2017-08-27 DIAGNOSIS — E119 Type 2 diabetes mellitus without complications: Secondary | ICD-10-CM | POA: Diagnosis not present

## 2017-08-27 DIAGNOSIS — Z7982 Long term (current) use of aspirin: Secondary | ICD-10-CM | POA: Insufficient documentation

## 2017-08-27 DIAGNOSIS — Z6837 Body mass index (BMI) 37.0-37.9, adult: Secondary | ICD-10-CM | POA: Diagnosis not present

## 2017-08-27 DIAGNOSIS — I10 Essential (primary) hypertension: Secondary | ICD-10-CM | POA: Insufficient documentation

## 2017-08-27 DIAGNOSIS — M1711 Unilateral primary osteoarthritis, right knee: Secondary | ICD-10-CM | POA: Diagnosis not present

## 2017-08-27 DIAGNOSIS — G8918 Other acute postprocedural pain: Secondary | ICD-10-CM | POA: Diagnosis not present

## 2017-08-27 DIAGNOSIS — Z79899 Other long term (current) drug therapy: Secondary | ICD-10-CM | POA: Insufficient documentation

## 2017-08-27 DIAGNOSIS — K219 Gastro-esophageal reflux disease without esophagitis: Secondary | ICD-10-CM | POA: Diagnosis not present

## 2017-08-27 DIAGNOSIS — E782 Mixed hyperlipidemia: Secondary | ICD-10-CM | POA: Insufficient documentation

## 2017-08-27 HISTORY — PX: PARTIAL KNEE ARTHROPLASTY: SHX2174

## 2017-08-27 LAB — GLUCOSE, CAPILLARY: Glucose-Capillary: 136 mg/dL — ABNORMAL HIGH (ref 70–99)

## 2017-08-27 SURGERY — ARTHROPLASTY, KNEE, UNICOMPARTMENTAL
Anesthesia: Monitor Anesthesia Care | Site: Knee | Laterality: Right

## 2017-08-27 MED ORDER — FENTANYL CITRATE (PF) 100 MCG/2ML IJ SOLN: 25.0000 ug | INTRAMUSCULAR | Status: DC | PRN

## 2017-08-27 MED ORDER — EPHEDRINE 5 MG/ML INJ
INTRAVENOUS | Status: AC
  Filled 2017-08-27: qty 10

## 2017-08-27 MED ORDER — SODIUM CHLORIDE 0.9 % IV SOLN
INTRAVENOUS | Status: DC | PRN
  Administered 2017-08-27: 20 ug/min via INTRAVENOUS

## 2017-08-27 MED ORDER — MIDAZOLAM HCL 2 MG/2ML IJ SOLN
1.0000 mg | INTRAMUSCULAR | Status: DC
  Administered 2017-08-27: 1 mg via INTRAVENOUS
  Filled 2017-08-27: qty 2

## 2017-08-27 MED ORDER — DEXAMETHASONE SODIUM PHOSPHATE 10 MG/ML IJ SOLN
8.0000 mg | Freq: Once | INTRAMUSCULAR | Status: AC
  Administered 2017-08-27: 8 mg via INTRAVENOUS

## 2017-08-27 MED ORDER — OXYCODONE HCL 5 MG PO TABS
ORAL_TABLET | ORAL | Status: AC
  Filled 2017-08-27: qty 4

## 2017-08-27 MED ORDER — ASPIRIN 325 MG PO TABS: 325.0000 mg | ORAL_TABLET | Freq: Two times a day (BID) | ORAL | 0 refills | Status: DC

## 2017-08-27 MED ORDER — CHLORHEXIDINE GLUCONATE 4 % EX LIQD: 60.0000 mL | Freq: Once | CUTANEOUS | Status: DC

## 2017-08-27 MED ORDER — CEFAZOLIN SODIUM-DEXTROSE 2-4 GM/100ML-% IV SOLN
2.0000 g | INTRAVENOUS | Status: AC
  Administered 2017-08-27: 2 g via INTRAVENOUS
  Filled 2017-08-27: qty 100

## 2017-08-27 MED ORDER — BUPIVACAINE IN DEXTROSE 0.75-8.25 % IT SOLN
INTRATHECAL | Status: DC | PRN
  Administered 2017-08-27: 15 mg via INTRATHECAL

## 2017-08-27 MED ORDER — ONDANSETRON HCL 4 MG/2ML IJ SOLN
INTRAMUSCULAR | Status: AC
  Filled 2017-08-27: qty 2

## 2017-08-27 MED ORDER — ONDANSETRON HCL 4 MG/2ML IJ SOLN
INTRAMUSCULAR | Status: DC | PRN
  Administered 2017-08-27: 4 mg via INTRAVENOUS

## 2017-08-27 MED ORDER — EPHEDRINE SULFATE-NACL 50-0.9 MG/10ML-% IV SOSY
PREFILLED_SYRINGE | INTRAVENOUS | Status: DC | PRN
  Administered 2017-08-27: 15 mg via INTRAVENOUS
  Administered 2017-08-27: 10 mg via INTRAVENOUS

## 2017-08-27 MED ORDER — OXYCODONE HCL 10 MG PO TABS: 10.0000 mg | ORAL_TABLET | Freq: Four times a day (QID) | ORAL | 0 refills | Status: AC | PRN

## 2017-08-27 MED ORDER — DEXAMETHASONE SODIUM PHOSPHATE 10 MG/ML IJ SOLN
INTRAMUSCULAR | Status: AC
  Filled 2017-08-27: qty 1

## 2017-08-27 MED ORDER — SODIUM CHLORIDE 0.9 % IR SOLN
Status: DC | PRN
  Administered 2017-08-27: 1000 mL

## 2017-08-27 MED ORDER — ACETAMINOPHEN 500 MG PO TABS
1000.0000 mg | ORAL_TABLET | Freq: Once | ORAL | Status: AC
  Administered 2017-08-27: 1000 mg via ORAL
  Filled 2017-08-27: qty 2

## 2017-08-27 MED ORDER — STERILE WATER FOR IRRIGATION IR SOLN
Status: DC | PRN
  Administered 2017-08-27: 2000 mL

## 2017-08-27 MED ORDER — PROMETHAZINE HCL 25 MG/ML IJ SOLN: 6.2500 mg | INTRAMUSCULAR | Status: DC | PRN

## 2017-08-27 MED ORDER — PROPOFOL 10 MG/ML IV BOLUS
INTRAVENOUS | Status: AC
  Filled 2017-08-27: qty 20

## 2017-08-27 MED ORDER — BUPIVACAINE-EPINEPHRINE (PF) 0.25% -1:200000 IJ SOLN
INTRAMUSCULAR | Status: AC
  Filled 2017-08-27: qty 30

## 2017-08-27 MED ORDER — METHOCARBAMOL 500 MG IVPB - SIMPLE MED
500.0000 mg | Freq: Once | INTRAVENOUS | Status: AC
  Administered 2017-08-27: 500 mg via INTRAVENOUS

## 2017-08-27 MED ORDER — BUPIVACAINE-EPINEPHRINE (PF) 0.25% -1:200000 IJ SOLN
INTRAMUSCULAR | Status: DC | PRN
  Administered 2017-08-27: 30 mL

## 2017-08-27 MED ORDER — ROPIVACAINE HCL 5 MG/ML IJ SOLN
INTRAMUSCULAR | Status: DC | PRN
  Administered 2017-08-27: 30 mL via PERINEURAL

## 2017-08-27 MED ORDER — TRANEXAMIC ACID 1000 MG/10ML IV SOLN
1000.0000 mg | INTRAVENOUS | Status: AC
  Administered 2017-08-27: 1000 mg via INTRAVENOUS
  Filled 2017-08-27: qty 10

## 2017-08-27 MED ORDER — BUPIVACAINE LIPOSOME 1.3 % IJ SUSP
INTRAMUSCULAR | Status: DC | PRN
  Administered 2017-08-27: 20 mL

## 2017-08-27 MED ORDER — FENTANYL CITRATE (PF) 100 MCG/2ML IJ SOLN
50.0000 ug | INTRAMUSCULAR | Status: DC
  Administered 2017-08-27: 100 ug via INTRAVENOUS
  Filled 2017-08-27: qty 2

## 2017-08-27 MED ORDER — METHOCARBAMOL 500 MG IVPB - SIMPLE MED
INTRAVENOUS | Status: AC
  Administered 2017-08-27: 500 mg via INTRAVENOUS
  Filled 2017-08-27: qty 50

## 2017-08-27 MED ORDER — GABAPENTIN 300 MG PO CAPS
300.0000 mg | ORAL_CAPSULE | Freq: Once | ORAL | Status: AC
  Administered 2017-08-27: 300 mg via ORAL
  Filled 2017-08-27: qty 1

## 2017-08-27 MED ORDER — PROPOFOL 500 MG/50ML IV EMUL
INTRAVENOUS | Status: DC | PRN
  Administered 2017-08-27: 125 ug/kg/min via INTRAVENOUS

## 2017-08-27 MED ORDER — PHENYLEPHRINE HCL 10 MG/ML IJ SOLN
INTRAMUSCULAR | Status: AC
  Filled 2017-08-27: qty 1

## 2017-08-27 MED ORDER — OXYCODONE HCL 5 MG PO TABS
10.0000 mg | ORAL_TABLET | Freq: Four times a day (QID) | ORAL | Status: DC | PRN
  Administered 2017-08-27: 20 mg via ORAL

## 2017-08-27 MED ORDER — PHENYLEPHRINE 40 MCG/ML (10ML) SYRINGE FOR IV PUSH (FOR BLOOD PRESSURE SUPPORT)
PREFILLED_SYRINGE | INTRAVENOUS | Status: DC | PRN
  Administered 2017-08-27: 120 ug via INTRAVENOUS
  Administered 2017-08-27 (×2): 80 ug via INTRAVENOUS

## 2017-08-27 MED ORDER — PROPOFOL 10 MG/ML IV BOLUS
INTRAVENOUS | Status: AC
  Filled 2017-08-27: qty 40

## 2017-08-27 MED ORDER — PROPOFOL 10 MG/ML IV BOLUS: INTRAVENOUS | Status: DC | PRN

## 2017-08-27 MED ORDER — PHENYLEPHRINE 40 MCG/ML (10ML) SYRINGE FOR IV PUSH (FOR BLOOD PRESSURE SUPPORT)
PREFILLED_SYRINGE | INTRAVENOUS | Status: AC
  Filled 2017-08-27: qty 10

## 2017-08-27 MED ORDER — LACTATED RINGERS IV SOLN
INTRAVENOUS | Status: DC
  Administered 2017-08-27 (×2): via INTRAVENOUS

## 2017-08-27 SURGICAL SUPPLY — 53 items
120581 (Knees) ×2 IMPLANT
BAG SPEC THK2 15X12 ZIP CLS (MISCELLANEOUS) ×1
BAG ZIPLOCK 12X15 (MISCELLANEOUS) ×3 IMPLANT
BANDAGE ACE 6X5 VEL STRL LF (GAUZE/BANDAGES/DRESSINGS) ×3 IMPLANT
BEARING TIBIAL UKA SZ2 9MM (Orthopedic Implant) ×1 IMPLANT
BOWL SMART MIX CTS (DISPOSABLE) ×3 IMPLANT
CEMENT BONE SIMPLEX SPEEDSET (Cement) ×6 IMPLANT
CLOSURE WOUND 1/2 X4 (GAUZE/BANDAGES/DRESSINGS) ×1
COVER SURGICAL LIGHT HANDLE (MISCELLANEOUS) ×3 IMPLANT
CUFF TOURN SGL QUICK 34 (TOURNIQUET CUFF) ×2
CUFF TOURN SGL QUICK 44 (TOURNIQUET CUFF) IMPLANT
CUFF TRNQT CYL 34X4X40X1 (TOURNIQUET CUFF) ×1 IMPLANT
DECANTER SPIKE VIAL GLASS SM (MISCELLANEOUS) ×3 IMPLANT
DRAPE INCISE IOBAN 66X45 STRL (DRAPES) ×6 IMPLANT
DRAPE LG THREE QUARTER DISP (DRAPES) ×3 IMPLANT
DRESSING AQUACEL AG SP 3.5X10 (GAUZE/BANDAGES/DRESSINGS) ×1 IMPLANT
DRSG AQUACEL AG SP 3.5X10 (GAUZE/BANDAGES/DRESSINGS) ×3
DURAPREP 26ML APPLICATOR (WOUND CARE) ×6 IMPLANT
ELECT REM PT RETURN 15FT ADLT (MISCELLANEOUS) ×3 IMPLANT
FEMORAL UKA IBAL RMED LLAT SZ3 (Knees) ×3 IMPLANT
GAUZE XEROFORM 5X9 LF (GAUZE/BANDAGES/DRESSINGS) ×3 IMPLANT
GLOVE BIOGEL PI IND STRL 7.5 (GLOVE) ×3 IMPLANT
GLOVE BIOGEL PI IND STRL 8.5 (GLOVE) ×3 IMPLANT
GLOVE BIOGEL PI INDICATOR 7.5 (GLOVE) ×6
GLOVE BIOGEL PI INDICATOR 8.5 (GLOVE) ×6
GLOVE SURG ORTHO 8.0 STRL STRW (GLOVE) ×15 IMPLANT
GLOVE SURG SS PI 7.5 STRL IVOR (GLOVE) ×3 IMPLANT
GOWN SPEC L3 XXLG W/TWL (GOWN DISPOSABLE) ×9 IMPLANT
GOWN STRL REUS W/TWL XL LVL3 (GOWN DISPOSABLE) ×3 IMPLANT
HANDPIECE INTERPULSE COAX TIP (DISPOSABLE) ×3
HOOD PEEL AWAY FLYTE STAYCOOL (MISCELLANEOUS) ×9 IMPLANT
IMMOBILIZER KNEE 20 (SOFTGOODS) ×3
IMMOBILIZER KNEE 20 THIGH 36 (SOFTGOODS) ×1 IMPLANT
MANIFOLD NEPTUNE II (INSTRUMENTS) ×3 IMPLANT
NEEDLE MA TROC 1/2 (NEEDLE) ×3 IMPLANT
PACK TOTAL KNEE CUSTOM (KITS) ×3 IMPLANT
POSITIONER SURGICAL ARM (MISCELLANEOUS) ×3 IMPLANT
SET HNDPC FAN SPRY TIP SCT (DISPOSABLE) ×1 IMPLANT
STRIP CLOSURE SKIN 1/2X4 (GAUZE/BANDAGES/DRESSINGS) ×2 IMPLANT
SUT BONE WAX W31G (SUTURE) ×3 IMPLANT
SUT MNCRL AB 3-0 PS2 18 (SUTURE) ×3 IMPLANT
SUT STRATAFIX 0 PDS 27 VIOLET (SUTURE) ×6
SUT VIC AB 0 CT1 27 (SUTURE) ×3
SUT VIC AB 0 CT1 27XBRD ANBCTR (SUTURE) ×1 IMPLANT
SUT VIC AB 1 CT1 36 (SUTURE) ×3 IMPLANT
SUT VIC AB 2-0 CT1 27 (SUTURE) ×2
SUT VIC AB 2-0 CT1 TAPERPNT 27 (SUTURE) ×1 IMPLANT
SUTURE STRATFX 0 PDS 27 VIOLET (SUTURE) ×2 IMPLANT
TIBIAL BEARING UKA SZ2 9MM (Orthopedic Implant) ×3 IMPLANT
TRAY FOLEY CATH 14FR (SET/KITS/TRAYS/PACK) ×3 IMPLANT
TRAY TIBIAL RIGHT MED SIZE 2 (Knees) ×3 IMPLANT
WRAP KNEE MAXI GEL POST OP (GAUZE/BANDAGES/DRESSINGS) ×3 IMPLANT
YANKAUER SUCT BULB TIP 10FT TU (MISCELLANEOUS) ×3 IMPLANT

## 2017-08-27 NOTE — Anesthesia Preprocedure Evaluation (Signed)
Anesthesia Evaluation  Patient identified by MRN, date of birth, ID band Patient awake    Reviewed: Allergy & Precautions, NPO status , Patient's Chart, lab work & pertinent test results, reviewed documented beta blocker date and time   History of Anesthesia Complications Negative for: history of anesthetic complications  Airway Mallampati: III  TM Distance: >3 FB Neck ROM: Full    Dental no notable dental hx. (+) Dental Advisory Given   Pulmonary asthma ,    Pulmonary exam normal        Cardiovascular hypertension, Pt. on medications and Pt. on home beta blockers Normal cardiovascular exam  Study Conclusions  - Left ventricle: The cavity size was normal. There was mild   concentric hypertrophy. Systolic function was normal. The   estimated ejection fraction was in the range of 60% to 65%. Wall   motion was normal; there were no regional wall motion   abnormalities.   Neuro/Psych negative neurological ROS  negative psych ROS   GI/Hepatic Neg liver ROS, GERD  ,  Endo/Other  negative endocrine ROSdiabetesMorbid obesity  Renal/GU negative Renal ROS  negative genitourinary   Musculoskeletal  (+) Fibromyalgia -  Abdominal   Peds negative pediatric ROS (+)  Hematology negative hematology ROS (+)   Anesthesia Other Findings   Reproductive/Obstetrics negative OB ROS                             Anesthesia Physical Anesthesia Plan  ASA: III  Anesthesia Plan: Spinal and MAC   Post-op Pain Management:  Regional for Post-op pain   Induction:   PONV Risk Score and Plan: 2 and Ondansetron and Propofol infusion  Airway Management Planned: Natural Airway and Simple Face Mask  Additional Equipment:   Intra-op Plan:   Post-operative Plan:   Informed Consent: I have reviewed the patients History and Physical, chart, labs and discussed the procedure including the risks, benefits and  alternatives for the proposed anesthesia with the patient or authorized representative who has indicated his/her understanding and acceptance.   Dental advisory given  Plan Discussed with: CRNA, Anesthesiologist and Surgeon  Anesthesia Plan Comments:         Anesthesia Quick Evaluation

## 2017-08-27 NOTE — Evaluation (Signed)
Physical Therapy Evaluation Patient Details Name: Carolyn FallenSandra F Andre MRN: [id number] DOB: [birth year] Today's Date: 08/27/2017   History of Present Illness  R UKA  Clinical Impression  The patient  Is noted to,have some weakness remaining in the  Right leg while ambulating without KI. Patient instructed to  Use KI for getting up 4 steps. Spouse instructed. Patient was noted to have bleeding from under KI and onto floor while getting dress with spouse assistance.RN notified, patient assisted onto bed.  The patient is to contact OPPT tomorrow for appointment. Pt admitted with above diagnosis. Pt currently with functional limitations due to the deficits listed below (see PT Problem List).  Pt will benefit from skilled PT to increase their independence and safety with mobility to allow discharge to the venue listed below.       Follow Up Recommendations Follow surgeon's recommendation for DC plan and follow-up therapies;Outpatient PT    Equipment Recommendations  None recommended by PT    Recommendations for Other Services       Precautions / Restrictions Precautions Precautions: Knee;Fall Required Braces or Orthoses: Knee Immobilizer - Right      Mobility  Bed Mobility Overal bed mobility: Needs Assistance Bed Mobility: Supine to Sit;Sit to Supine     Supine to sit: Min guard Sit to supine: Min guard      Transfers Overall transfer level: Needs assistance Equipment used: Rolling walker (2 wheeled) Transfers: Sit to/from UGI CorporationStand;Stand Pivot Transfers Sit to Stand: Min assist Stand pivot transfers: Min assist          Ambulation/Gait Ambulation/Gait assistance: Mod assist Gait Distance (Feet): 40 Feet Assistive device: Rolling walker (2 wheeled) Gait Pattern/deviations: Step-to pattern     General Gait Details: noted buckling of the right knee x 3 when ambulating back to bed. KI placed  for safety to wear home  Stairs            Wheelchair Mobility    Modified  Rankin (Stroke Patients Only)       Balance                                             Pertinent Vitals/Pain Pain Assessment: 0-10 Pain Score: 3  Pain Location: right knee Pain Descriptors / Indicators: Discomfort Pain Intervention(s): Monitored during session    Home Living Family/patient expects to be discharged to:: Private residence Living Arrangements: Spouse/significant other Available Help at Discharge: Family Type of Home: House Home Access: Stairs to enter Entrance Stairs-Rails: Right   Home Layout: One level Home Equipment: Environmental consultantWalker - 2 wheels;Cane - single point      Prior Function Level of Independence: Independent               Hand Dominance        Extremity/Trunk Assessment   Upper Extremity Assessment Upper Extremity Assessment: Overall WFL for tasks assessed    Lower Extremity Assessment Lower Extremity Assessment: RLE deficits/detail;LLE deficits/detail RLE Deficits / Details: patient reports  feeling some numbness in plantar , in buttocks LLE Deficits / Details: more sensation in this leg    Cervical / Trunk Assessment Cervical / Trunk Assessment: Normal  Communication   Communication: No difficulties  Cognition Arousal/Alertness: Awake/alert Behavior During Therapy: WFL for tasks assessed/performed Overall Cognitive Status: Within Functional Limits for tasks assessed  General Comments      Exercises Total Joint Exercises Ankle Circles/Pumps: AROM;Both;10 reps Quad Sets: AROM;Both;10 reps Heel Slides: AROM;Right;10 reps Hip ABduction/ADduction: AROM;Right;10 reps Straight Leg Raises: AROM;Right;10 reps Long Arc Quad: AROM;Right;10 reps Knee Flexion: AROM;Right;10 reps   Assessment/Plan    PT Assessment Patient needs continued PT services  PT Problem List Decreased strength;Decreased range of motion;Decreased activity tolerance;Decreased  mobility;Decreased knowledge of precautions;Decreased safety awareness;Decreased knowledge of use of DME;Pain       PT Treatment Interventions DME instruction;Gait training;Stair training;Functional mobility training;Therapeutic activities;Therapeutic exercise;Patient/family education    PT Goals (Current goals can be found in the Care Plan section)  Acute Rehab PT Goals Patient Stated Goal: to go home PT Goal Formulation: With patient Time For Goal Achievement: 08/30/17 Potential to Achieve Goals: Good    Frequency 7X/week   Barriers to discharge        Co-evaluation               AM-PAC PT "6 Clicks" Daily Activity  Outcome Measure Difficulty turning over in bed (including adjusting bedclothes, sheets and blankets)?: None Difficulty moving from lying on back to sitting on the side of the bed? : None Difficulty sitting down on and standing up from a chair with arms (e.g., wheelchair, bedside commode, etc,.)?: A Little Help needed moving to and from a bed to chair (including a wheelchair)?: A Little Help needed walking in hospital room?: A Lot Help needed climbing 3-5 steps with a railing? : Total 6 Click Score: 17    End of Session Equipment Utilized During Treatment: Gait belt Activity Tolerance: Patient tolerated treatment well Patient left: in bed;with call bell/phone within reach Nurse Communication: Mobility status PT Visit Diagnosis: Unsteadiness on feet (R26.81)    Time: [id number] PT Time Calculation (min) (ACUTE ONLY): 34 min   Charges:   PT Evaluation $PT Eval Low Complexity: 1 Low PT Treatments $Gait Training: 8-22 mins        [place] PortKaren Cristal Qadir PT [phone]   Rada HayHill, Siarah Deleo Elizabeth 08/27/2017, 6:54 PM

## 2017-08-27 NOTE — Progress Notes (Signed)
Pt seen by PT, ambulated in hallway.  Upon returning to her bay, pt began to have bleeding from her incision which soaked through the surgical dressing and began to drip on the floor. Leg elevated, pressure applied, and bleeding appeared to stop.    [address] Etna[place]by Robbins, [place] [phone]), to discuss.  He advised we remove the dressing and apply ABD pads with a new compression wrap, using good compression at the knee. Advised that the pt is to rest the leg as much as possible tonight, elevate the leg and ice the knee through the night.  Pt may resume activity/exercises in AM. Pt updated and verbalized understanding.   Also of note, the operative note states that the pt has a hemovac and a pain catheter. Neither are present upon assessment.  Terese DoorColby is aware of this.  Ardyth GalAnderson, Fredia Chittenden Ann, RN 08/27/2017

## 2017-08-27 NOTE — Transfer of Care (Signed)
Immediate Anesthesia Transfer of Care Note  Patient: Carolyn Arnold  Procedure(s) Performed: UNICOMPARTMENTAL RIGHT KNEE (Right Knee)  Patient Location: PACU  Anesthesia Type:Regional and Spinal  Level of Consciousness: awake, alert  and oriented  Airway & Oxygen Therapy: Patient Spontanous Breathing and Patient connected to face mask oxygen  Post-op Assessment: Report given to RN and Post -op Vital signs reviewed and stable  Post vital signs: Reviewed and stable  Last Vitals:  Vitals Value Taken Time  BP 124/98 08/27/2017  1:23 PM  Temp    Pulse 82 08/27/2017  1:24 PM  Resp 20 08/27/2017  1:24 PM  SpO2 100 % 08/27/2017  1:24 PM  Vitals shown include unvalidated device data.  Last Pain:  Vitals:   08/27/17 1026  TempSrc:   PainSc: 3       Patients Stated Pain Goal: 4 (08/27/17 1026)  Complications: No apparent anesthesia complications

## 2017-08-27 NOTE — H&P (Signed)
Carolyn Arnold MRN:  [id number] DOB/SEX:  [birth year]/female  CHIEF COMPLAINT:  Painful right Knee  HISTORY: Patient is a 62 y.o. female presented with a history of pain in the right knee. Onset of symptoms was gradual starting a few years ago with gradually worsening course since that time. Patient has been treated conservatively with over-the-counter NSAIDs and activity modification. Patient currently rates pain in the knee at 10 out of 10 with activity. There is pain at night.  PAST MEDICAL HISTORY: Patient Active Problem List   Diagnosis Date Noted  . Essential hypertension 06/07/2017  . Diabetes mellitus affecting pregnancy, unspecified trimester 06/07/2017  . Mixed dyslipidemia 06/07/2017  . Pre-operative cardiovascular examination 06/07/2017  . HTN (hypertension) 05/22/2017  . Right knee pain 05/22/2017  . BMI 39.0-39.9,adult 05/22/2017  . GERD 07/20/2009  . DYSPNEA 07/20/2009  . METABOLIC SYNDROME X 10/29/2008  . DIABETES MELLITUS, BORDERLINE 10/29/2008   Past Medical History:  Diagnosis Date  . ALLERGIC RHINITIS 07/20/2009   Qualifier: Diagnosis of  By: Delton Coombes MD, [place]   . Asthma    INTERMIETTENT ASTHMA USES INHALER PRN  . 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 Carpal tunnel syndrome    BILATERAL  . Chronic lower back pain   . Diet-controlled type 2 diabetes mellitus (HCC)   . Dysrhythmia    HEART PAPITATIONS  . Family history of adverse reaction to anesthesia    SISTER TROUBLE WAKING UP  . FATTY LIVER DISEASE, HX OF 10/29/2008   Qualifier: Diagnosis of  By: Denyse Amass CMA, Carol    . Fibromyalgia   . GERD (gastroesophageal reflux disease)   . Gout   . Hyperlipidemia   . Hypertension   . IBS (irritable bowel syndrome)   . Intermittent constipation   . Mitral valve prolapse   . MVP (mitral valve prolapse)   . Pancreatitis 20 YRS AGO  . Pericardial effusion    Chronic  . Renal stones    SCARRING INSIDE BOTH KIDNEYS PER PATIENT    Past Surgical History:  Procedure Laterality Date  . CHOLECYSTECTOMY  1993   LAPAROSCOPIC IN [place]  . ELBOW FRACTURE SURGERY Right 1973  . KNEE ARTHROSCOPY Right 2017  . LUMBAR LAMINECTOMY  1998 & 2008   2 OR 3 RAY CARGES LOWER BACK WITH TITANOIM PLATES AND SCREWS PER PT  . right shoulder clavical reduction for arthritis  2016  . SHOULDER SURGERY Left    by Dr. Thedore Mins for bone spurs  . TONSILLECTOMY AND ADENOIDECTOMY  AS CHILD  . TOTAL ABDOMINAL HYSTERECTOMY W/ BILATERAL SALPINGOOPHORECTOMY  1988   for endometriosis     MEDICATIONS:   No medications prior to admission.    ALLERGIES:   Allergies  Allergen Reactions  . Ambien [Zolpidem Tartrate] Other (See Comments)    Irregular rhythm  . Sulfonamide Derivatives Hives and Rash  . Welchol [Colesevelam Hcl] Other (See Comments)    Liver function abnormalities  . Zolpidem Tartrate   . Celecoxib Hives and Rash  . Cox-2 Inhibitors Rash and Hypertension  . Cymbalta [Duloxetine Hcl] Nausea And Vomiting  . Mobic [Meloxicam] Other (See Comments)    Heartburn  . Rofecoxib Hives and Rash    (Vioxx)  . Trazodone And Nefazodone Palpitations  . Zyprexa [Olanzapine] Palpitations    REVIEW OF SYSTEMS:  A comprehensive review of systems was negative except for: Musculoskeletal: positive for arthralgias and bone pain   FAMILY HISTORY:   Family History  Problem Relation Age of Onset  . Coronary artery disease Mother   . Breast cancer Mother   . Colon cancer Mother   . Lung cancer Mother   . COPD Mother   . COPD Father   . Hypertension Sister   . Hyperlipidemia Sister   . Breast cancer Sister   . Diabetes Sister   . Coronary artery disease Brother   . Hyperlipidemia Brother   . Breast cancer Maternal Aunt     SOCIAL HISTORY:   Social History   Tobacco Use  . Smoking status: Never Smoker  . Smokeless tobacco: Never Used  Substance Use Topics  . Alcohol use: Never    Frequency: Never     EXAMINATION:  Vital  signs in last 24 hours:    There were no vitals taken for this visit.  General Appearance:    Alert, cooperative, no distress, appears stated age  Head:    Normocephalic, without obvious abnormality, atraumatic  Eyes:    PERRL, conjunctiva/corneas clear, EOM's intact, fundi    benign, both eyes  Ears:    Normal TM's and external ear canals, both ears  Nose:   Nares normal, septum midline, mucosa normal, no drainage    or sinus tenderness  Throat:   Lips, mucosa, and tongue normal; teeth and gums normal  Neck:   Supple, symmetrical, trachea midline, no adenopathy;    thyroid:  no enlargement/tenderness/nodules; no carotid   bruit or JVD  Back:     Symmetric, no curvature, ROM normal, no CVA tenderness  Lungs:     Clear to auscultation bilaterally, respirations unlabored  Chest Wall:    No tenderness or deformity   Heart:    Regular rate and rhythm, S1 and S2 normal, no murmur, rub   or gallop  Breast Exam:    No tenderness, masses, or nipple abnormality  Abdomen:     Soft, non-tender, bowel sounds active all four quadrants,    no masses, no organomegaly  Genitalia:    Normal female without lesion, discharge or tenderness  Rectal:    Normal tone, no masses or tenderness;   guaiac negative stool  Extremities:   Extremities normal, atraumatic, no cyanosis or edema  Pulses:   2+ and symmetric all extremities  Skin:   Skin color, texture, turgor normal, no rashes or lesions  Lymph nodes:   Cervical, supraclavicular, and axillary nodes normal  Neurologic:   CNII-XII intact, normal strength, sensation and reflexes    throughout    Musculoskeletal:  ROM 0-120, Ligaments intact,  Imaging Review Plain radiographs demonstrate severe degenerative joint disease of the right knee. The overall alignment is neutral. The bone quality appears to be excellent for age and reported activity level.  Assessment/Plan: Primary osteoarthritis, right knee   The patient history, physical examination and  imaging studies are consistent with advanced degenerative joint disease of the right knee. The patient has failed conservative treatment.  The clearance notes were reviewed.  After discussion with the patient it was felt that Total Knee Replacement was indicated. The procedure,  risks, and benefits of total knee arthroplasty were presented and reviewed. The risks including but not limited to aseptic loosening, infection, blood clots, vascular injury, stiffness, patella tracking problems complications among others were discussed. The patient acknowledged the explanation, agreed to proceed with the plan.  Preoperative templating of the joint replacement has been completed, documented, and submitted to the Operating Room personnel in order to optimize intra-operative equipment management.   Patient's  anticipated LOS is less than 2 midnights, meeting these requirements: - Younger than 2665 - Lives within 1 hour of care - Has a competent adult at home to recover with post-op recover - NO history of  - Chronic pain requiring opiods  - Diabetes  - Coronary Artery Disease  - Heart failure  - Heart attack  - Stroke  - DVT/VTE  - Cardiac arrhythmia  - Respiratory Failure/COPD  - Renal failure  - Anemia  - Advanced Liver disease        Carolyn SandiferColby Alan Andie Arnold 08/27/2017, 9:06 AM

## 2017-08-27 NOTE — Discharge Summary (Signed)
SPORTS MEDICINE & JOINT REPLACEMENT   Georgena SpurlingStephen Lucey, MD   Laurier Nancyolby Cinnamon Morency, PA-C [address], [place], [place]  [id number]                             [phone]  PATIENT ID: Carolyn Arnold        MRN:  [id number]          DOB/AGE: Jul 04, 1955 / 62 y.o.    DISCHARGE SUMMARY  ADMISSION DATE:    08/27/2017 DISCHARGE DATE:   08/27/2017   ADMISSION DIAGNOSIS: Osteoarthritis RT. Knee    DISCHARGE DIAGNOSIS:  Osteoarthritis RT. Knee    ADDITIONAL DIAGNOSIS: Active Problems:   * No active hospital problems. *  Past Medical History:  Diagnosis Date  . ALLERGIC RHINITIS 07/20/2009   Qualifier: Diagnosis of  By: Delton CoombesByrum MD, Les Pouobert S   . Asthma    INTERMIETTENT ASTHMA USES INHALER PRN  . 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 Carpal tunnel syndrome    BILATERAL  . Chronic lower back pain   . Diet-controlled type 2 diabetes mellitus (HCC)   . Dysrhythmia    HEART PAPITATIONS  . Family history of adverse reaction to anesthesia    SISTER TROUBLE WAKING UP  . FATTY LIVER DISEASE, HX OF 10/29/2008   Qualifier: Diagnosis of  By: Denyse AmassFiato, CMA, Carol    . Fibromyalgia   . GERD (gastroesophageal reflux disease)   . Gout   . Hyperlipidemia   . Hypertension   . IBS (irritable bowel syndrome)   . Intermittent constipation   . Mitral valve prolapse   . MVP (mitral valve prolapse)   . Pancreatitis 20 YRS AGO  . Pericardial effusion    Chronic  . Renal stones    SCARRING INSIDE BOTH KIDNEYS PER PATIENT    PROCEDURE: Procedure(s): UNICOMPARTMENTAL RIGHT KNEE on 08/27/2017  CONSULTS:    HISTORY:  See H&P in chart  HOSPITAL COURSE:  Carolyn FallenSandra F Zalesky is a 62 y.o. admitted on 08/27/2017 and found to have a diagnosis of Osteoarthritis RT. Knee.  After appropriate laboratory studies were obtained  they were taken to the operating room on 08/27/2017 and underwent Procedure(s): UNICOMPARTMENTAL RIGHT KNEE.   They were given perioperative antibiotics:   Anti-infectives (From admission, onward)   Start     Dose/Rate Route Frequency Ordered Stop   08/27/17 1015  ceFAZolin (ANCEF) IVPB 2g/100 mL premix     2 g 200 mL/hr over 30 Minutes Intravenous On call to O.R. 08/27/17 1004 08/27/17 1201    .  Patient given tranexamic acid IV or topical and exparel intra-operatively.  Tolerated the procedure well.       The remainder of the hospital course was dedicated to ambulation and strengthening.   The patient was discharged on Day of Surgery in  Good condition.  Blood products given:none  DIAGNOSTIC STUDIES: Recent vital signs:  Patient Vitals for the past 24 hrs:  BP Temp Temp src Pulse Resp SpO2 Height Weight  08/27/17 1123 - - - 71 17 99 % - -  08/27/17 1122 (!) 148/108 - - - 18 - - -  08/27/17 1121 - - - 71 17 99 % - -  08/27/17 1120 (!) 148/90 - - - 17 - - -  08/27/17 1119 - - - - 18 - - -  08/27/17 1118 - - - - 15 - - -  08/27/17 1117 (!) 147/63 - - - (!) 21 100 % - -  08/27/17 1116 - - - - 18 - - -  08/27/17 1115 - - - - 19 - - -  08/27/17 1114 - - - - 14 - - -  08/27/17 1113 - - - - 13 - - -  08/27/17 1112 - - - - 13 - - -  08/27/17 1111 - - - - (!) 21 - - -  08/27/17 1110 - - - - 17 - - -  08/27/17 1109 - - - - 19 - - -  08/27/17 1108 - - - - 12 - - -  08/27/17 1107 - - - - 12 - - -  08/27/17 1106 - - - - 17 - - -  08/27/17 1105 - - - - 14 - - -  08/27/17 1104 - - - - 20 - - -  08/27/17 1103 - - - - 13 - - -  08/27/17 1026 - - - - - - 5\' 4"  (1.626 m) 98.4 kg  08/27/17 1000 (!) 137/98 98.2 F (36.8 C) Oral 74 16 100 % - -       Recent laboratory studies: No results for input(s): WBC, HGB, HCT, PLT in the last 168 hours. No results for input(s): NA, K, CL, CO2, BUN, CREATININE, GLUCOSE, CALCIUM in the last 168 hours. No results found for: INR, PROTIME   Recent Radiographic Studies :  No results found.  DISCHARGE INSTRUCTIONS: Discharge Instructions    CPM   Complete by:  As directed    Continuous passive  motion machine (CPM):      Use the CPM from 0 to 90 for 4-6 hours per day.      You may increase by 10 per day.  You may break it up into 2 or 3 sessions per day.      Use CPM for 2 weeks or until you are told to stop.   Call MD / Call 911   Complete by:  As directed    If you experience chest pain or shortness of breath, CALL 911 and be transported to the hospital emergency room.  If you develope a fever above 101 F, pus (white drainage) or increased drainage or redness at the wound, or calf pain, call your surgeon's office.   Constipation Prevention   Complete by:  As directed    Drink plenty of fluids.  Prune juice may be helpful.  You may use a stool softener, such as Colace (over the counter) 100 mg twice a day.  Use MiraLax (over the counter) for constipation as needed.   Diet - low sodium heart healthy   Complete by:  As directed    Discharge instructions   Complete by:  As directed    INSTRUCTIONS AFTER JOINT REPLACEMENT   Remove items at home which could result in a fall. This includes throw rugs or furniture in walking pathways ICE to the affected joint every three hours while awake for 30 minutes at a time, for at least the first 3-5 days, and then as needed for pain and swelling.  Continue to use ice for pain and swelling. You may notice swelling that will progress down to the foot and ankle.  This is normal after surgery.  Elevate your leg when you are not up walking on it.   Continue to use the breathing machine you got in the hospital (incentive spirometer) which will help keep your temperature  down.  It is common for your temperature to cycle up and down following surgery, especially at night when you are not up moving around and exerting yourself.  The breathing machine keeps your lungs expanded and your temperature down.   DIET:  As you were doing prior to hospitalization, we recommend a well-balanced diet.  DRESSING / WOUND CARE / SHOWERING  Keep the surgical dressing  until follow up.  The dressing is water proof, so you can shower without any extra covering.  IF THE DRESSING FALLS OFF or the wound gets wet inside, change the dressing with sterile gauze.  Please use good hand washing techniques before changing the dressing.  Do not use any lotions or creams on the incision until instructed by your surgeon.    ACTIVITY  Increase activity slowly as tolerated, but follow the weight bearing instructions below.   No driving for 6 weeks or until further direction given by your physician.  You cannot drive while taking narcotics.  No lifting or carrying greater than 10 lbs. until further directed by your surgeon. Avoid periods of inactivity such as sitting longer than an hour when not asleep. This helps prevent blood clots.  You may return to work once you are authorized by your doctor.     WEIGHT BEARING   Weight bearing as tolerated with assist device (walker, cane, etc) as directed, use it as long as suggested by your surgeon or therapist, typically at least 4-6 weeks.   EXERCISES  Results after joint replacement surgery are often greatly improved when you follow the exercise, range of motion and muscle strengthening exercises prescribed by your doctor. Safety measures are also important to protect the joint from further injury. Any time any of these exercises cause you to have increased pain or swelling, decrease what you are doing until you are comfortable again and then slowly increase them. If you have problems or questions, call your caregiver or physical therapist for advice.   Rehabilitation is important following a joint replacement. After just a few days of immobilization, the muscles of the leg can become weakened and shrink (atrophy).  These exercises are designed to build up the tone and strength of the thigh and leg muscles and to improve motion. Often times heat used for twenty to thirty minutes before working out will loosen up your tissues and  help with improving the range of motion but do not use heat for the first two weeks following surgery (sometimes heat can increase post-operative swelling).   These exercises can be done on a training (exercise) mat, on the floor, on a table or on a bed. Use whatever works the best and is most comfortable for you.    Use music or television while you are exercising so that the exercises are a pleasant break in your day. This will make your life better with the exercises acting as a break in your routine that you can look forward to.   Perform all exercises about fifteen times, three times per day or as directed.  You should exercise both the operative leg and the other leg as well.   Exercises include:   Quad Sets - Tighten up the muscle on the front of the thigh (Quad) and hold for 5-10 seconds.   Straight Leg Raises - With your knee straight (if you were given a brace, keep it on), lift the leg to 60 degrees, hold for 3 seconds, and slowly lower the leg.  Perform this exercise against  resistance later as your leg gets stronger.  Leg Slides: Lying on your back, slowly slide your foot toward your buttocks, bending your knee up off the floor (only go as far as is comfortable). Then slowly slide your foot back down until your leg is flat on the floor again.  Angel Wings: Lying on your back spread your legs to the side as far apart as you can without causing discomfort.  Hamstring Strength:  Lying on your back, push your heel against the floor with your leg straight by tightening up the muscles of your buttocks.  Repeat, but this time bend your knee to a comfortable angle, and push your heel against the floor.  You may put a pillow under the heel to make it more comfortable if necessary.   A rehabilitation program following joint replacement surgery can speed recovery and prevent re-injury in the future due to weakened muscles. Contact your doctor or a physical therapist for more information on knee  rehabilitation.    CONSTIPATION  Constipation is defined medically as fewer than three stools per week and severe constipation as less than one stool per week.  Even if you have a regular bowel pattern at home, your normal regimen is likely to be disrupted due to multiple reasons following surgery.  Combination of anesthesia, postoperative narcotics, change in appetite and fluid intake all can affect your bowels.   YOU MUST use at least one of the following options; they are listed in order of increasing strength to get the job done.  They are all available over the counter, and you may need to use some, POSSIBLY even all of these options:    Drink plenty of fluids (prune juice may be helpful) and high fiber foods Colace 100 mg by mouth twice a day  Senokot for constipation as directed and as needed Dulcolax (bisacodyl), take with full glass of water  Miralax (polyethylene glycol) once or twice a day as needed.  If you have tried all these things and are unable to have a bowel movement in the first 3-4 days after surgery call either your surgeon or your primary doctor.    If you experience loose stools or diarrhea, hold the medications until you stool forms back up.  If your symptoms do not get better within 1 week or if they get worse, check with your doctor.  If you experience "the worst abdominal pain ever" or develop nausea or vomiting, please contact the office immediately for further recommendations for treatment.   ITCHING:  If you experience itching with your medications, try taking only a single pain pill, or even half a pain pill at a time.  You can also use Benadryl over the counter for itching or also to help with sleep.   TED HOSE STOCKINGS:  Use stockings on both legs until for at least 2 weeks or as directed by physician office. They may be removed at night for sleeping.  MEDICATIONS:  See your medication summary on the "After Visit Summary" that nursing will review with you.   You may have some home medications which will be placed on hold until you complete the course of blood thinner medication.  It is important for you to complete the blood thinner medication as prescribed.  PRECAUTIONS:  If you experience chest pain or shortness of breath - call 911 immediately for transfer to the hospital emergency department.   If you develop a fever greater that 101 F, purulent drainage from wound, increased redness  or drainage from wound, foul odor from the wound/dressing, or calf pain - CONTACT YOUR SURGEON.                                                   FOLLOW-UP APPOINTMENTS:  If you do not already have a post-op appointment, please call the office for an appointment to be seen by your surgeon.  Guidelines for how soon to be seen are listed in your "After Visit Summary", but are typically between 1-4 weeks after surgery.  OTHER INSTRUCTIONS:   Knee Replacement:  Do not place pillow under knee, focus on keeping the knee straight while resting. CPM instructions: 0-90 degrees, 2 hours in the morning, 2 hours in the afternoon, and 2 hours in the evening. Place foam block, curve side up under heel at all times except when in CPM or when walking.  DO NOT modify, tear, cut, or change the foam block in any way.  MAKE SURE YOU:  Understand these instructions.  Get help right away if you are not doing well or get worse.    Thank you for letting us be a part of your medical care team.  It is a privilege we respect greatly.  We hope these instructions will help you stay on track for a fast and full recovery!   Increase activity slowly as tolerated   Complete by:  As directed       DISCHARGE MEDICATIONS:   Allergies as of 08/27/2017      Reactions   Ambien [zolpidem Tartrate] Other (See Comments)   Irregular rhythm   Sulfonamide Derivatives Hives, Rash   Welchol [colesevelam Hcl] Other (See Comments)   Liver function abnormalities   Zolpidem Tartrate    Celecoxib Hives,  Rash   Cox-2 Inhibitors Rash, Hypertension   Cymbalta [duloxetine Hcl] Nausea And Vomiting   Mobic [meloxicam] Other (See Comments)   Heartburn   Rofecoxib Hives, Rash   (Vioxx)   Trazodone And Nefazodone Palpitations   Zyprexa [olanzapine] Palpitations      Medication List    STOP taking these medications   ibuprofen 600 MG tablet Commonly known as:  ADVIL,MOTRIN     TAKE these medications   albuterol 108 (90 Base) MCG/ACT inhaler Commonly known as:  PROVENTIL HFA;VENTOLIN HFA Inhale 1-2 puffs into the lungs every 6 (six) hours as needed for wheezing or shortness of breath.   allopurinol 300 MG tablet Commonly known as:  ZYLOPRIM Take 300 mg by mouth at bedtime.   aspirin 325 MG tablet Take 1 tablet (325 mg total) by mouth 2 (two) times daily.   atenolol 50 MG tablet Commonly known as:  TENORMIN Take 50 mg by mouth at bedtime.   BIOTENE DRY MOUTH DT Place 1 spray onto teeth 3 (three) times daily as needed (for dry mouth).   cholecalciferol 1000 units tablet Commonly known as:  VITAMIN D Take 3,000 Units by mouth at bedtime.   cyclobenzaprine 10 MG tablet Commonly known as:  FLEXERIL Take 10 mg by mouth 3 (three) times daily as needed ((typically scheduled twice daily)). For back pain   diazepam 5 MG tablet Commonly known as:  VALIUM Take 5 mg by mouth 2 (two) times daily as needed for anxiety.   esomeprazole 40 MG capsule Commonly known as:  NEXIUM Take 40 mg by mouth at bedtime.  fluticasone 50 MCG/ACT nasal spray Commonly known as:  FLONASE Place 2 sprays into both nostrils daily.   gabapentin 300 MG capsule Commonly known as:  NEURONTIN Take 900 mg by mouth 2 (two) times daily.   gentamicin ointment 0.1 % Commonly known as:  GARAMYCIN Apply 1 application topically 3 (three) times daily as needed (for skin rash).   hydroxypropyl methylcellulose / hypromellose 2.5 % ophthalmic solution Commonly known as:  ISOPTO TEARS / GONIOVISC Place 1 drop into  both eyes 3 (three) times daily as needed for dry eyes.   hydrOXYzine 10 MG tablet Commonly known as:  ATARAX/VISTARIL Take 10 mg by mouth every 6 (six) hours as needed for itching.   hyoscyamine 0.125 MG SL tablet Commonly known as:  LEVSIN SL Place 0.125 mg under the tongue 3 (three) times daily as needed (for abdominal pain (IBS)).   meclizine 25 MG tablet Commonly known as:  ANTIVERT Take 25 mg by mouth every 6 (six) hours as needed for dizziness.   milk thistle 175 MG tablet Take 175 mg by mouth at bedtime.   Oxycodone HCl 10 MG Tabs Take 1-2 tablets (10-20 mg total) by mouth every 6 (six) hours as needed. What changed:    how much to take  reasons to take this   polyethylene glycol packet Commonly known as:  MIRALAX / GLYCOLAX Take 17 g by mouth daily as needed (for constipation.).   pravastatin 40 MG tablet Commonly known as:  PRAVACHOL Take 40 mg by mouth at bedtime.   sertraline 100 MG tablet Commonly known as:  ZOLOFT Take 150 mg by mouth at bedtime.   triamcinolone cream 0.1 % Commonly known as:  KENALOG Apply 1 application topically 2 (two) times daily as needed (For itching and rash).       FOLLOW UP VISIT:    DISPOSITION: HOME VS. SNF  CONDITION:  Good   Guy Sandifer 08/27/2017, 1:28 PM

## 2017-08-27 NOTE — Op Note (Signed)
UNI KNEE REPLACEMENT OPERATIVE NOTE:  08/27/2017  1:26 PM  PATIENT:  Carolyn Arnold  62 y.o. female  PRE-OPERATIVE DIAGNOSIS:  Osteoarthritis RT. Knee  POST-OPERATIVE DIAGNOSIS:  Osteoarthritis RT. Knee  PROCEDURE:  Procedure(s): UNICOMPARTMENTAL RIGHT KNEE  SURGEON:  Surgeon(s): Dannielle HuhLucey, Isaiah Cianci, MD  PHYSICIAN ASSISTANT: Laurier Nancyolby Robbins, [facility]  ANESTHESIA:   spinal  DRAINS: Hemovac  SPECIMEN: None  COUNTS:  Correct  TOURNIQUET:   Total Tourniquet Time Documented: Thigh (Right) - 36 minutes Total: Thigh (Right) - 36 minutes   DICTATION:  Indication for procedure:    The patient is a 62 y.o. female who has failed conservative treatment for Osteoarthritis RT. Knee.  Informed consent was obtained prior to anesthesia. The risks versus benefits of the operation were explain and in a way the patient can, and did, understand.   On the implant demand matching protocol, this patient scored 15.  Therefore, this patient was receive a polyethylene insert with vitamin E which is a high demand implant.  Description of procedure:     The patient was taken to the operating room and placed under anesthesia.  The patient was positioned in the usual fashion taking care that all body parts were adequately padded and/or protected.  I foley catheter was placed.  A tourniquet was applied and the leg prepped and draped in the usual sterile fashion.  The extremity was exsanguinated with the esmarch and tourniquet inflated to 350 mmHg.  Pre-operative range of motion was normal.  The knee was in 5 degree of mild varus.  A midline incision approximately 3-4 inches long was made with a #10 blade.  A new blade was used to make a parapatellar arthrotomy going 1 cm into the quadriceps tendon, over the patella, and alongside the medial aspect of the patellar tendon.  A synovectomy was then performed with the #10 blade and forceps. I then elevated the deep MCL off the medial tibial flare. The knee was put at 90  degrees and the patient specific cutting blocks were used to make our proximal tibial cut and distal femoral cut. The medial meniscus was removed at this point.  I then used the C cutting guide on the femur to drill for lugs and cut the chamfers. Likewise, a 2 tibial baseplate was used to prepare the tibia. I then trialed the  C femur and 2 tibia. I trialed several poly inserts and a 9 mm achieved good balance in flexion and extension.  I then irrigated copiously and then mixed the cement. I injected exparel in the deep soft tissues at this point. I then cemented the tibia first followed by the femur and removed excess cement and then inserted the polyethylene. I placed the leg in extension and finished injecting the rest of the exparel.  BLOOD LOSS:  300cc DRAINS: 1 hemovac, 1 pain catheter COMPLICATIONS:  None.  PLAN OF CARE: Admit for overnight observation  PATIENT DISPOSITION:  PACU - hemodynamically stable.   Delay start of Pharmacological VTE agent (>24hrs) due to surgical blood loss or risk of bleeding:  not applicable  Please fax a copy of this op note to my office at [phone] (please only include page 1 and 2 of the Case Information op note)

## 2017-08-27 NOTE — OR Nursing (Signed)
Patient knee redressed as instructed with ABD, kerlex, compression stocking, wrap, and knee immobilizer, advised patient to rest and elevate as instructed and when to call office. D/C instructions reviewed with patient and husband both verbalized understanding.  RX given to Carolyn Arnold (spouse) reviewed instructions on how to take medications. Patient and spouse verbalized understanding.

## 2017-08-27 NOTE — Anesthesia Postprocedure Evaluation (Signed)
Anesthesia Post Note  Patient: Carolyn Arnold  Procedure(s) Performed: UNICOMPARTMENTAL RIGHT KNEE (Right Knee)     Patient location during evaluation: PACU Anesthesia Type: MAC and Spinal Level of consciousness: awake and alert Pain management: pain level controlled Vital Signs Assessment: post-procedure vital signs reviewed and stable Respiratory status: spontaneous breathing and respiratory function stable Cardiovascular status: blood pressure returned to baseline and stable Postop Assessment: spinal receding Anesthetic complications: no    Last Vitals:  Vitals:   08/27/17 1400 08/27/17 1415  BP: 140/60 137/67  Pulse: 74 73  Resp: (!) 21 14  Temp:    SpO2: 94% 98%    Last Pain:  Vitals:   08/27/17 1415  TempSrc:   PainSc: 0-No pain                 Ayron Fillinger DANIEL

## 2017-08-27 NOTE — Anesthesia Procedure Notes (Signed)
Anesthesia Regional Block: Adductor canal block   Pre-Anesthetic Checklist: ,, timeout performed, Correct Patient, Correct Site, Correct Laterality, Correct Procedure, Correct Position, site marked, Risks and benefits discussed,  Surgical consent,  Pre-op evaluation,  At surgeon's request and post-op pain management  Laterality: Right  Prep: chloraprep       Needles:  Injection technique: Single-shot  Needle Type: Stimulator Needle - 80     Needle Length: 10cm  Needle Gauge: 21     Additional Needles:   Narrative:  Start time: 08/27/2017 11:01 AM End time: 08/27/2017 11:11 AM Injection made incrementally with aspirations every 5 mL.  Performed by: Personally

## 2017-08-27 NOTE — Progress Notes (Signed)
Assisted Dr. Singer with right, ultrasound guided, adductor canal block. Side rails up, monitors on throughout procedure. See vital signs in flow sheet. Tolerated Procedure well.  

## 2017-08-27 NOTE — Anesthesia Procedure Notes (Signed)
Spinal  Patient location during procedure: OR Start time: 08/27/2017 11:52 AM End time: 08/27/2017 12:02 PM Staffing Anesthesiologist: Heather RobertsSinger, Meera Vasco, MD Performed: anesthesiologist  Preanesthetic Checklist Completed: patient identified, surgical consent, pre-op evaluation, timeout performed, IV checked, risks and benefits discussed and monitors and equipment checked Spinal Block Patient position: sitting Prep: DuraPrep Patient monitoring: cardiac monitor, continuous pulse ox and blood pressure Approach: midline Location: L2-3 Injection technique: single-shot Needle Needle type: Pencan  Needle gauge: 24 G Needle length: 9 cm Additional Notes Functioning IV was confirmed and monitors were applied. Sterile prep and drape, including hand hygiene and sterile gloves were used. The patient was positioned and the spine was prepped. The skin was anesthetized with lidocaine.  Free flow of clear CSF was obtained prior to injecting local anesthetic into the CSF.  The spinal needle aspirated freely following injection.  The needle was carefully withdrawn.  The patient tolerated the procedure well.

## 2017-08-28 ENCOUNTER — Encounter (HOSPITAL_COMMUNITY): Payer: Self-pay | Admitting: Orthopedic Surgery

## 2017-08-28 DIAGNOSIS — M1711 Unilateral primary osteoarthritis, right knee: Secondary | ICD-10-CM | POA: Diagnosis not present

## 2017-08-30 DIAGNOSIS — Z96651 Presence of right artificial knee joint: Secondary | ICD-10-CM | POA: Diagnosis not present

## 2017-08-30 DIAGNOSIS — M62551 Muscle wasting and atrophy, not elsewhere classified, right thigh: Secondary | ICD-10-CM | POA: Diagnosis not present

## 2017-08-30 DIAGNOSIS — M25461 Effusion, right knee: Secondary | ICD-10-CM | POA: Diagnosis not present

## 2017-08-30 DIAGNOSIS — R2689 Other abnormalities of gait and mobility: Secondary | ICD-10-CM | POA: Diagnosis not present

## 2017-08-30 DIAGNOSIS — M25561 Pain in right knee: Secondary | ICD-10-CM | POA: Diagnosis not present

## 2017-09-03 DIAGNOSIS — M62551 Muscle wasting and atrophy, not elsewhere classified, right thigh: Secondary | ICD-10-CM | POA: Diagnosis not present

## 2017-09-03 DIAGNOSIS — M25561 Pain in right knee: Secondary | ICD-10-CM | POA: Diagnosis not present

## 2017-09-03 DIAGNOSIS — Z96651 Presence of right artificial knee joint: Secondary | ICD-10-CM | POA: Diagnosis not present

## 2017-09-03 DIAGNOSIS — M25461 Effusion, right knee: Secondary | ICD-10-CM | POA: Diagnosis not present

## 2017-09-03 DIAGNOSIS — R2689 Other abnormalities of gait and mobility: Secondary | ICD-10-CM | POA: Diagnosis not present

## 2017-09-04 DIAGNOSIS — Z96651 Presence of right artificial knee joint: Secondary | ICD-10-CM | POA: Diagnosis not present

## 2017-09-05 DIAGNOSIS — M62551 Muscle wasting and atrophy, not elsewhere classified, right thigh: Secondary | ICD-10-CM | POA: Diagnosis not present

## 2017-09-05 DIAGNOSIS — R2689 Other abnormalities of gait and mobility: Secondary | ICD-10-CM | POA: Diagnosis not present

## 2017-09-05 DIAGNOSIS — M25461 Effusion, right knee: Secondary | ICD-10-CM | POA: Diagnosis not present

## 2017-09-05 DIAGNOSIS — M25561 Pain in right knee: Secondary | ICD-10-CM | POA: Diagnosis not present

## 2017-09-05 DIAGNOSIS — Z96651 Presence of right artificial knee joint: Secondary | ICD-10-CM | POA: Diagnosis not present

## 2017-09-07 DIAGNOSIS — R2689 Other abnormalities of gait and mobility: Secondary | ICD-10-CM | POA: Diagnosis not present

## 2017-09-07 DIAGNOSIS — Z96651 Presence of right artificial knee joint: Secondary | ICD-10-CM | POA: Diagnosis not present

## 2017-09-07 DIAGNOSIS — M62551 Muscle wasting and atrophy, not elsewhere classified, right thigh: Secondary | ICD-10-CM | POA: Diagnosis not present

## 2017-09-07 DIAGNOSIS — M25561 Pain in right knee: Secondary | ICD-10-CM | POA: Diagnosis not present

## 2017-09-07 DIAGNOSIS — M25461 Effusion, right knee: Secondary | ICD-10-CM | POA: Diagnosis not present

## 2017-09-11 DIAGNOSIS — M25561 Pain in right knee: Secondary | ICD-10-CM | POA: Diagnosis not present

## 2017-09-11 DIAGNOSIS — M25461 Effusion, right knee: Secondary | ICD-10-CM | POA: Diagnosis not present

## 2017-09-11 DIAGNOSIS — Z96651 Presence of right artificial knee joint: Secondary | ICD-10-CM | POA: Diagnosis not present

## 2017-09-11 DIAGNOSIS — M62551 Muscle wasting and atrophy, not elsewhere classified, right thigh: Secondary | ICD-10-CM | POA: Diagnosis not present

## 2017-09-11 DIAGNOSIS — R2689 Other abnormalities of gait and mobility: Secondary | ICD-10-CM | POA: Diagnosis not present

## 2017-09-13 DIAGNOSIS — Z96651 Presence of right artificial knee joint: Secondary | ICD-10-CM | POA: Diagnosis not present

## 2017-09-13 DIAGNOSIS — M25561 Pain in right knee: Secondary | ICD-10-CM | POA: Diagnosis not present

## 2017-09-13 DIAGNOSIS — M25461 Effusion, right knee: Secondary | ICD-10-CM | POA: Diagnosis not present

## 2017-09-13 DIAGNOSIS — M62551 Muscle wasting and atrophy, not elsewhere classified, right thigh: Secondary | ICD-10-CM | POA: Diagnosis not present

## 2017-09-13 DIAGNOSIS — R2689 Other abnormalities of gait and mobility: Secondary | ICD-10-CM | POA: Diagnosis not present

## 2017-09-18 DIAGNOSIS — M62551 Muscle wasting and atrophy, not elsewhere classified, right thigh: Secondary | ICD-10-CM | POA: Diagnosis not present

## 2017-09-18 DIAGNOSIS — R2689 Other abnormalities of gait and mobility: Secondary | ICD-10-CM | POA: Diagnosis not present

## 2017-09-18 DIAGNOSIS — M25461 Effusion, right knee: Secondary | ICD-10-CM | POA: Diagnosis not present

## 2017-09-18 DIAGNOSIS — Z96651 Presence of right artificial knee joint: Secondary | ICD-10-CM | POA: Diagnosis not present

## 2017-09-18 DIAGNOSIS — M25561 Pain in right knee: Secondary | ICD-10-CM | POA: Diagnosis not present

## 2017-09-20 DIAGNOSIS — M25561 Pain in right knee: Secondary | ICD-10-CM | POA: Diagnosis not present

## 2017-09-20 DIAGNOSIS — M62551 Muscle wasting and atrophy, not elsewhere classified, right thigh: Secondary | ICD-10-CM | POA: Diagnosis not present

## 2017-09-20 DIAGNOSIS — R2689 Other abnormalities of gait and mobility: Secondary | ICD-10-CM | POA: Diagnosis not present

## 2017-09-20 DIAGNOSIS — M25461 Effusion, right knee: Secondary | ICD-10-CM | POA: Diagnosis not present

## 2017-09-20 DIAGNOSIS — Z96651 Presence of right artificial knee joint: Secondary | ICD-10-CM | POA: Diagnosis not present

## 2017-09-25 DIAGNOSIS — M25461 Effusion, right knee: Secondary | ICD-10-CM | POA: Diagnosis not present

## 2017-09-25 DIAGNOSIS — Z96651 Presence of right artificial knee joint: Secondary | ICD-10-CM | POA: Diagnosis not present

## 2017-09-25 DIAGNOSIS — M25561 Pain in right knee: Secondary | ICD-10-CM | POA: Diagnosis not present

## 2017-09-25 DIAGNOSIS — M62551 Muscle wasting and atrophy, not elsewhere classified, right thigh: Secondary | ICD-10-CM | POA: Diagnosis not present

## 2017-09-25 DIAGNOSIS — R2689 Other abnormalities of gait and mobility: Secondary | ICD-10-CM | POA: Diagnosis not present

## 2017-09-27 DIAGNOSIS — M25461 Effusion, right knee: Secondary | ICD-10-CM | POA: Diagnosis not present

## 2017-09-27 DIAGNOSIS — M62551 Muscle wasting and atrophy, not elsewhere classified, right thigh: Secondary | ICD-10-CM | POA: Diagnosis not present

## 2017-09-27 DIAGNOSIS — Z96651 Presence of right artificial knee joint: Secondary | ICD-10-CM | POA: Diagnosis not present

## 2017-09-27 DIAGNOSIS — R2689 Other abnormalities of gait and mobility: Secondary | ICD-10-CM | POA: Diagnosis not present

## 2017-09-27 DIAGNOSIS — M25561 Pain in right knee: Secondary | ICD-10-CM | POA: Diagnosis not present

## 2017-10-01 DIAGNOSIS — Z96651 Presence of right artificial knee joint: Secondary | ICD-10-CM | POA: Diagnosis not present

## 2017-10-01 DIAGNOSIS — M62551 Muscle wasting and atrophy, not elsewhere classified, right thigh: Secondary | ICD-10-CM | POA: Diagnosis not present

## 2017-10-01 DIAGNOSIS — M25461 Effusion, right knee: Secondary | ICD-10-CM | POA: Diagnosis not present

## 2017-10-01 DIAGNOSIS — R2689 Other abnormalities of gait and mobility: Secondary | ICD-10-CM | POA: Diagnosis not present

## 2017-10-01 DIAGNOSIS — M25561 Pain in right knee: Secondary | ICD-10-CM | POA: Diagnosis not present

## 2017-10-04 DIAGNOSIS — M62551 Muscle wasting and atrophy, not elsewhere classified, right thigh: Secondary | ICD-10-CM | POA: Diagnosis not present

## 2017-10-04 DIAGNOSIS — M25461 Effusion, right knee: Secondary | ICD-10-CM | POA: Diagnosis not present

## 2017-10-04 DIAGNOSIS — Z96651 Presence of right artificial knee joint: Secondary | ICD-10-CM | POA: Diagnosis not present

## 2017-10-04 DIAGNOSIS — M25561 Pain in right knee: Secondary | ICD-10-CM | POA: Diagnosis not present

## 2017-10-04 DIAGNOSIS — R2689 Other abnormalities of gait and mobility: Secondary | ICD-10-CM | POA: Diagnosis not present

## 2017-10-09 DIAGNOSIS — M25561 Pain in right knee: Secondary | ICD-10-CM | POA: Diagnosis not present

## 2017-10-09 DIAGNOSIS — M25461 Effusion, right knee: Secondary | ICD-10-CM | POA: Diagnosis not present

## 2017-10-09 DIAGNOSIS — Z96651 Presence of right artificial knee joint: Secondary | ICD-10-CM | POA: Diagnosis not present

## 2017-10-09 DIAGNOSIS — R2689 Other abnormalities of gait and mobility: Secondary | ICD-10-CM | POA: Diagnosis not present

## 2017-10-09 DIAGNOSIS — M62551 Muscle wasting and atrophy, not elsewhere classified, right thigh: Secondary | ICD-10-CM | POA: Diagnosis not present

## 2017-10-11 DIAGNOSIS — R2689 Other abnormalities of gait and mobility: Secondary | ICD-10-CM | POA: Diagnosis not present

## 2017-10-11 DIAGNOSIS — M25461 Effusion, right knee: Secondary | ICD-10-CM | POA: Diagnosis not present

## 2017-10-11 DIAGNOSIS — M25561 Pain in right knee: Secondary | ICD-10-CM | POA: Diagnosis not present

## 2017-10-11 DIAGNOSIS — Z96651 Presence of right artificial knee joint: Secondary | ICD-10-CM | POA: Diagnosis not present

## 2017-10-11 DIAGNOSIS — M62551 Muscle wasting and atrophy, not elsewhere classified, right thigh: Secondary | ICD-10-CM | POA: Diagnosis not present

## 2017-10-16 DIAGNOSIS — M25561 Pain in right knee: Secondary | ICD-10-CM | POA: Diagnosis not present

## 2017-10-16 DIAGNOSIS — Z96651 Presence of right artificial knee joint: Secondary | ICD-10-CM | POA: Diagnosis not present

## 2017-10-16 DIAGNOSIS — R2689 Other abnormalities of gait and mobility: Secondary | ICD-10-CM | POA: Diagnosis not present

## 2017-10-16 DIAGNOSIS — M62551 Muscle wasting and atrophy, not elsewhere classified, right thigh: Secondary | ICD-10-CM | POA: Diagnosis not present

## 2017-10-16 DIAGNOSIS — M25461 Effusion, right knee: Secondary | ICD-10-CM | POA: Diagnosis not present

## 2017-10-17 DIAGNOSIS — Z23 Encounter for immunization: Secondary | ICD-10-CM | POA: Diagnosis not present

## 2017-10-17 DIAGNOSIS — F418 Other specified anxiety disorders: Secondary | ICD-10-CM | POA: Diagnosis not present

## 2017-10-17 DIAGNOSIS — E7800 Pure hypercholesterolemia, unspecified: Secondary | ICD-10-CM | POA: Diagnosis not present

## 2017-10-17 DIAGNOSIS — Z6835 Body mass index (BMI) 35.0-35.9, adult: Secondary | ICD-10-CM | POA: Diagnosis not present

## 2017-10-17 DIAGNOSIS — L988 Other specified disorders of the skin and subcutaneous tissue: Secondary | ICD-10-CM | POA: Diagnosis not present

## 2017-10-17 DIAGNOSIS — E669 Obesity, unspecified: Secondary | ICD-10-CM | POA: Diagnosis not present

## 2017-10-17 DIAGNOSIS — E1169 Type 2 diabetes mellitus with other specified complication: Secondary | ICD-10-CM | POA: Diagnosis not present

## 2017-10-17 DIAGNOSIS — I10 Essential (primary) hypertension: Secondary | ICD-10-CM | POA: Diagnosis not present

## 2017-10-23 DIAGNOSIS — Z96651 Presence of right artificial knee joint: Secondary | ICD-10-CM | POA: Diagnosis not present

## 2017-10-23 DIAGNOSIS — R2689 Other abnormalities of gait and mobility: Secondary | ICD-10-CM | POA: Diagnosis not present

## 2017-10-23 DIAGNOSIS — M25461 Effusion, right knee: Secondary | ICD-10-CM | POA: Diagnosis not present

## 2017-10-23 DIAGNOSIS — M62551 Muscle wasting and atrophy, not elsewhere classified, right thigh: Secondary | ICD-10-CM | POA: Diagnosis not present

## 2017-10-23 DIAGNOSIS — M25561 Pain in right knee: Secondary | ICD-10-CM | POA: Diagnosis not present

## 2017-12-27 DIAGNOSIS — R0602 Shortness of breath: Secondary | ICD-10-CM | POA: Diagnosis not present

## 2017-12-27 DIAGNOSIS — K219 Gastro-esophageal reflux disease without esophagitis: Secondary | ICD-10-CM | POA: Diagnosis not present

## 2017-12-27 DIAGNOSIS — R21 Rash and other nonspecific skin eruption: Secondary | ICD-10-CM | POA: Diagnosis not present

## 2018-01-15 DIAGNOSIS — Z1231 Encounter for screening mammogram for malignant neoplasm of breast: Secondary | ICD-10-CM | POA: Diagnosis not present

## 2018-02-12 DIAGNOSIS — L570 Actinic keratosis: Secondary | ICD-10-CM | POA: Diagnosis not present

## 2018-02-18 DIAGNOSIS — K219 Gastro-esophageal reflux disease without esophagitis: Secondary | ICD-10-CM | POA: Diagnosis not present

## 2018-02-18 DIAGNOSIS — K760 Fatty (change of) liver, not elsewhere classified: Secondary | ICD-10-CM | POA: Diagnosis not present

## 2018-02-18 DIAGNOSIS — I10 Essential (primary) hypertension: Secondary | ICD-10-CM | POA: Diagnosis not present

## 2018-02-18 DIAGNOSIS — E1169 Type 2 diabetes mellitus with other specified complication: Secondary | ICD-10-CM | POA: Diagnosis not present

## 2018-02-18 DIAGNOSIS — Z6837 Body mass index (BMI) 37.0-37.9, adult: Secondary | ICD-10-CM | POA: Diagnosis not present

## 2018-02-18 DIAGNOSIS — E669 Obesity, unspecified: Secondary | ICD-10-CM | POA: Diagnosis not present

## 2018-02-18 DIAGNOSIS — E7800 Pure hypercholesterolemia, unspecified: Secondary | ICD-10-CM | POA: Diagnosis not present

## 2018-02-18 DIAGNOSIS — F418 Other specified anxiety disorders: Secondary | ICD-10-CM | POA: Diagnosis not present

## 2018-06-04 DIAGNOSIS — Z90710 Acquired absence of both cervix and uterus: Secondary | ICD-10-CM | POA: Diagnosis not present

## 2018-06-04 DIAGNOSIS — Z0000 Encounter for general adult medical examination without abnormal findings: Secondary | ICD-10-CM | POA: Diagnosis not present

## 2018-06-04 DIAGNOSIS — E1169 Type 2 diabetes mellitus with other specified complication: Secondary | ICD-10-CM | POA: Diagnosis not present

## 2018-06-04 DIAGNOSIS — E7800 Pure hypercholesterolemia, unspecified: Secondary | ICD-10-CM | POA: Diagnosis not present

## 2018-06-04 DIAGNOSIS — I10 Essential (primary) hypertension: Secondary | ICD-10-CM | POA: Diagnosis not present

## 2018-09-04 DIAGNOSIS — Z79891 Long term (current) use of opiate analgesic: Secondary | ICD-10-CM | POA: Diagnosis not present

## 2018-09-04 DIAGNOSIS — E790 Hyperuricemia without signs of inflammatory arthritis and tophaceous disease: Secondary | ICD-10-CM | POA: Diagnosis not present

## 2018-09-04 DIAGNOSIS — E1169 Type 2 diabetes mellitus with other specified complication: Secondary | ICD-10-CM | POA: Diagnosis not present

## 2018-09-04 DIAGNOSIS — E7800 Pure hypercholesterolemia, unspecified: Secondary | ICD-10-CM | POA: Diagnosis not present

## 2018-09-04 DIAGNOSIS — K760 Fatty (change of) liver, not elsewhere classified: Secondary | ICD-10-CM | POA: Diagnosis not present

## 2018-09-04 DIAGNOSIS — F419 Anxiety disorder, unspecified: Secondary | ICD-10-CM | POA: Diagnosis not present

## 2018-09-04 DIAGNOSIS — I10 Essential (primary) hypertension: Secondary | ICD-10-CM | POA: Diagnosis not present

## 2018-09-04 DIAGNOSIS — F5101 Primary insomnia: Secondary | ICD-10-CM | POA: Diagnosis not present

## 2018-09-04 DIAGNOSIS — G8929 Other chronic pain: Secondary | ICD-10-CM | POA: Diagnosis not present

## 2018-09-04 DIAGNOSIS — Z79899 Other long term (current) drug therapy: Secondary | ICD-10-CM | POA: Diagnosis not present

## 2018-09-04 DIAGNOSIS — Z6838 Body mass index (BMI) 38.0-38.9, adult: Secondary | ICD-10-CM | POA: Diagnosis not present

## 2018-11-01 DIAGNOSIS — J22 Unspecified acute lower respiratory infection: Secondary | ICD-10-CM | POA: Diagnosis not present

## 2018-11-01 DIAGNOSIS — J069 Acute upper respiratory infection, unspecified: Secondary | ICD-10-CM | POA: Diagnosis not present

## 2018-11-06 DIAGNOSIS — U071 COVID-19: Secondary | ICD-10-CM | POA: Diagnosis not present

## 2018-12-04 DIAGNOSIS — I10 Essential (primary) hypertension: Secondary | ICD-10-CM | POA: Diagnosis not present

## 2018-12-04 DIAGNOSIS — E1169 Type 2 diabetes mellitus with other specified complication: Secondary | ICD-10-CM | POA: Diagnosis not present

## 2018-12-04 DIAGNOSIS — E7800 Pure hypercholesterolemia, unspecified: Secondary | ICD-10-CM | POA: Diagnosis not present

## 2019-03-17 DIAGNOSIS — Z0100 Encounter for examination of eyes and vision without abnormal findings: Secondary | ICD-10-CM | POA: Diagnosis not present

## 2019-03-17 DIAGNOSIS — E119 Type 2 diabetes mellitus without complications: Secondary | ICD-10-CM | POA: Diagnosis not present

## 2019-03-24 DIAGNOSIS — I10 Essential (primary) hypertension: Secondary | ICD-10-CM | POA: Diagnosis not present

## 2019-03-24 DIAGNOSIS — E1169 Type 2 diabetes mellitus with other specified complication: Secondary | ICD-10-CM | POA: Diagnosis not present

## 2019-03-24 DIAGNOSIS — E7800 Pure hypercholesterolemia, unspecified: Secondary | ICD-10-CM | POA: Diagnosis not present

## 2019-04-28 DIAGNOSIS — I10 Essential (primary) hypertension: Secondary | ICD-10-CM | POA: Diagnosis not present

## 2019-04-28 DIAGNOSIS — Z6836 Body mass index (BMI) 36.0-36.9, adult: Secondary | ICD-10-CM | POA: Diagnosis not present

## 2019-04-28 DIAGNOSIS — E1169 Type 2 diabetes mellitus with other specified complication: Secondary | ICD-10-CM | POA: Diagnosis not present

## 2019-04-28 DIAGNOSIS — H00014 Hordeolum externum left upper eyelid: Secondary | ICD-10-CM | POA: Diagnosis not present

## 2019-04-28 DIAGNOSIS — E7800 Pure hypercholesterolemia, unspecified: Secondary | ICD-10-CM | POA: Diagnosis not present

## 2019-04-29 DIAGNOSIS — H00014 Hordeolum externum left upper eyelid: Secondary | ICD-10-CM | POA: Diagnosis not present

## 2019-05-21 DIAGNOSIS — L209 Atopic dermatitis, unspecified: Secondary | ICD-10-CM | POA: Diagnosis not present

## 2019-07-22 DIAGNOSIS — J22 Unspecified acute lower respiratory infection: Secondary | ICD-10-CM | POA: Diagnosis not present

## 2019-08-08 DIAGNOSIS — R05 Cough: Secondary | ICD-10-CM | POA: Diagnosis not present

## 2019-08-08 DIAGNOSIS — E669 Obesity, unspecified: Secondary | ICD-10-CM | POA: Diagnosis not present

## 2019-08-08 DIAGNOSIS — F418 Other specified anxiety disorders: Secondary | ICD-10-CM | POA: Diagnosis not present

## 2019-08-08 DIAGNOSIS — I10 Essential (primary) hypertension: Secondary | ICD-10-CM | POA: Diagnosis not present

## 2019-08-08 DIAGNOSIS — Z6836 Body mass index (BMI) 36.0-36.9, adult: Secondary | ICD-10-CM | POA: Diagnosis not present

## 2019-08-08 DIAGNOSIS — G8929 Other chronic pain: Secondary | ICD-10-CM | POA: Diagnosis not present

## 2019-08-08 DIAGNOSIS — E7800 Pure hypercholesterolemia, unspecified: Secondary | ICD-10-CM | POA: Diagnosis not present

## 2019-08-08 DIAGNOSIS — E1169 Type 2 diabetes mellitus with other specified complication: Secondary | ICD-10-CM | POA: Diagnosis not present

## 2019-08-08 DIAGNOSIS — K219 Gastro-esophageal reflux disease without esophagitis: Secondary | ICD-10-CM | POA: Diagnosis not present

## 2019-08-18 DIAGNOSIS — M542 Cervicalgia: Secondary | ICD-10-CM | POA: Diagnosis not present

## 2019-09-11 DIAGNOSIS — M542 Cervicalgia: Secondary | ICD-10-CM | POA: Diagnosis not present

## 2019-09-11 DIAGNOSIS — M5412 Radiculopathy, cervical region: Secondary | ICD-10-CM | POA: Diagnosis not present

## 2019-09-11 DIAGNOSIS — M25511 Pain in right shoulder: Secondary | ICD-10-CM | POA: Diagnosis not present

## 2019-09-17 DIAGNOSIS — M542 Cervicalgia: Secondary | ICD-10-CM | POA: Diagnosis not present

## 2019-09-19 DIAGNOSIS — M542 Cervicalgia: Secondary | ICD-10-CM | POA: Diagnosis not present

## 2019-09-23 DIAGNOSIS — M542 Cervicalgia: Secondary | ICD-10-CM | POA: Diagnosis not present

## 2019-09-26 DIAGNOSIS — M542 Cervicalgia: Secondary | ICD-10-CM | POA: Diagnosis not present

## 2019-09-30 DIAGNOSIS — M542 Cervicalgia: Secondary | ICD-10-CM | POA: Diagnosis not present

## 2019-10-03 DIAGNOSIS — M542 Cervicalgia: Secondary | ICD-10-CM | POA: Diagnosis not present

## 2019-10-06 DIAGNOSIS — M542 Cervicalgia: Secondary | ICD-10-CM | POA: Diagnosis not present

## 2019-10-08 DIAGNOSIS — M542 Cervicalgia: Secondary | ICD-10-CM | POA: Diagnosis not present

## 2019-10-23 DIAGNOSIS — Z86010 Personal history of colonic polyps: Secondary | ICD-10-CM | POA: Diagnosis not present

## 2019-10-23 DIAGNOSIS — Z9181 History of falling: Secondary | ICD-10-CM | POA: Diagnosis not present

## 2019-10-23 DIAGNOSIS — K219 Gastro-esophageal reflux disease without esophagitis: Secondary | ICD-10-CM | POA: Diagnosis not present

## 2019-10-23 DIAGNOSIS — R0602 Shortness of breath: Secondary | ICD-10-CM | POA: Diagnosis not present

## 2019-10-23 DIAGNOSIS — E1169 Type 2 diabetes mellitus with other specified complication: Secondary | ICD-10-CM | POA: Diagnosis not present

## 2019-10-23 DIAGNOSIS — Z6836 Body mass index (BMI) 36.0-36.9, adult: Secondary | ICD-10-CM | POA: Diagnosis not present

## 2019-10-23 DIAGNOSIS — E790 Hyperuricemia without signs of inflammatory arthritis and tophaceous disease: Secondary | ICD-10-CM | POA: Diagnosis not present

## 2019-10-23 DIAGNOSIS — Z0000 Encounter for general adult medical examination without abnormal findings: Secondary | ICD-10-CM | POA: Diagnosis not present

## 2019-10-23 DIAGNOSIS — I10 Essential (primary) hypertension: Secondary | ICD-10-CM | POA: Diagnosis not present

## 2020-01-27 DIAGNOSIS — F419 Anxiety disorder, unspecified: Secondary | ICD-10-CM | POA: Diagnosis not present

## 2020-01-27 DIAGNOSIS — K219 Gastro-esophageal reflux disease without esophagitis: Secondary | ICD-10-CM | POA: Diagnosis not present

## 2020-01-27 DIAGNOSIS — E1169 Type 2 diabetes mellitus with other specified complication: Secondary | ICD-10-CM | POA: Diagnosis not present

## 2020-01-27 DIAGNOSIS — E669 Obesity, unspecified: Secondary | ICD-10-CM | POA: Diagnosis not present

## 2020-01-27 DIAGNOSIS — Z6836 Body mass index (BMI) 36.0-36.9, adult: Secondary | ICD-10-CM | POA: Diagnosis not present

## 2020-01-27 DIAGNOSIS — E559 Vitamin D deficiency, unspecified: Secondary | ICD-10-CM | POA: Diagnosis not present

## 2020-01-27 DIAGNOSIS — E7800 Pure hypercholesterolemia, unspecified: Secondary | ICD-10-CM | POA: Diagnosis not present

## 2020-01-27 DIAGNOSIS — I10 Essential (primary) hypertension: Secondary | ICD-10-CM | POA: Diagnosis not present

## 2020-02-10 DIAGNOSIS — Z96651 Presence of right artificial knee joint: Secondary | ICD-10-CM | POA: Diagnosis not present

## 2020-02-10 DIAGNOSIS — S8991XA Unspecified injury of right lower leg, initial encounter: Secondary | ICD-10-CM | POA: Diagnosis not present

## 2020-02-10 DIAGNOSIS — M7989 Other specified soft tissue disorders: Secondary | ICD-10-CM | POA: Diagnosis not present

## 2020-04-23 IMAGING — NM NM MISC PROCEDURE
5 series · 30 of 30 positions shown · non-contrast
Comparison: none

[Series 1: stress - gated · 6.51mm/px · 6 of 511 frames shown]
[frame 43/511]
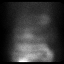
[frame 128/511]
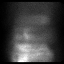
[frame 213/511]
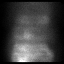
[frame 298/511]
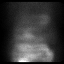
[frame 383/511]
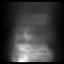
[frame 469/511]
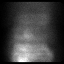

[Series 1: wbr_s-proj_st stress - gated · 6.51mm/px · 6 of 512 frames shown]
[frame 43/512]
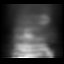
[frame 128/512]
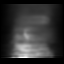
[frame 214/512]
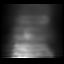
[frame 299/512]
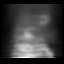
[frame 384/512]
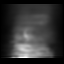
[frame 470/512]
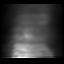

[Series 2: stress - perfusion · 6.51mm/px · 6 of 64 frames shown]
[frame 6/64]
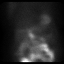
[frame 16/64]
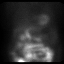
[frame 27/64]
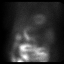
[frame 38/64]
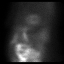
[frame 48/64]
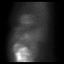
[frame 59/64]
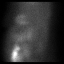

[Series 3: rest · 6.51mm/px · 6 of 64 frames shown]
[frame 6/64]
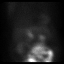
[frame 16/64]
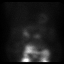
[frame 27/64]
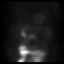
[frame 38/64]
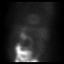
[frame 48/64]
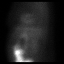
[frame 59/64]
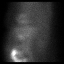

[Series 3: wbr_r-proj_st rest · 6.51mm/px · 6 of 64 frames shown]
[frame 6/64]
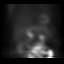
[frame 16/64]
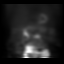
[frame 27/64]
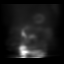
[frame 38/64]
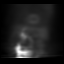
[frame 48/64]
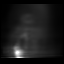
[frame 59/64]
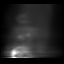

[30 of 30 positions shown; findings below may reference images not displayed]

Canned report from images found in remote index.

Refer to host system for actual result text.

## 2020-05-13 DIAGNOSIS — I10 Essential (primary) hypertension: Secondary | ICD-10-CM | POA: Diagnosis not present

## 2020-05-13 DIAGNOSIS — F418 Other specified anxiety disorders: Secondary | ICD-10-CM | POA: Diagnosis not present

## 2020-05-13 DIAGNOSIS — E669 Obesity, unspecified: Secondary | ICD-10-CM | POA: Diagnosis not present

## 2020-05-13 DIAGNOSIS — E1169 Type 2 diabetes mellitus with other specified complication: Secondary | ICD-10-CM | POA: Diagnosis not present

## 2020-05-13 DIAGNOSIS — M7061 Trochanteric bursitis, right hip: Secondary | ICD-10-CM | POA: Diagnosis not present

## 2020-05-13 DIAGNOSIS — Z1231 Encounter for screening mammogram for malignant neoplasm of breast: Secondary | ICD-10-CM | POA: Diagnosis not present

## 2020-05-13 DIAGNOSIS — Z6836 Body mass index (BMI) 36.0-36.9, adult: Secondary | ICD-10-CM | POA: Diagnosis not present

## 2020-05-13 DIAGNOSIS — E7800 Pure hypercholesterolemia, unspecified: Secondary | ICD-10-CM | POA: Diagnosis not present

## 2020-05-13 DIAGNOSIS — Z79899 Other long term (current) drug therapy: Secondary | ICD-10-CM | POA: Diagnosis not present

## 2020-06-08 DIAGNOSIS — Z1231 Encounter for screening mammogram for malignant neoplasm of breast: Secondary | ICD-10-CM | POA: Diagnosis not present

## 2020-06-09 DIAGNOSIS — H2513 Age-related nuclear cataract, bilateral: Secondary | ICD-10-CM | POA: Diagnosis not present

## 2020-06-09 DIAGNOSIS — Z0100 Encounter for examination of eyes and vision without abnormal findings: Secondary | ICD-10-CM | POA: Diagnosis not present

## 2020-06-09 DIAGNOSIS — H52221 Regular astigmatism, right eye: Secondary | ICD-10-CM | POA: Diagnosis not present

## 2020-06-09 DIAGNOSIS — H5203 Hypermetropia, bilateral: Secondary | ICD-10-CM | POA: Diagnosis not present

## 2020-06-09 DIAGNOSIS — H353130 Nonexudative age-related macular degeneration, bilateral, stage unspecified: Secondary | ICD-10-CM | POA: Diagnosis not present

## 2020-07-08 DIAGNOSIS — E7800 Pure hypercholesterolemia, unspecified: Secondary | ICD-10-CM | POA: Diagnosis not present

## 2020-07-08 DIAGNOSIS — I10 Essential (primary) hypertension: Secondary | ICD-10-CM | POA: Diagnosis not present

## 2020-07-08 DIAGNOSIS — E119 Type 2 diabetes mellitus without complications: Secondary | ICD-10-CM | POA: Diagnosis not present

## 2020-08-04 DIAGNOSIS — S80869A Insect bite (nonvenomous), unspecified lower leg, initial encounter: Secondary | ICD-10-CM | POA: Diagnosis not present

## 2020-09-21 DIAGNOSIS — M542 Cervicalgia: Secondary | ICD-10-CM | POA: Diagnosis not present

## 2020-09-27 DIAGNOSIS — M25512 Pain in left shoulder: Secondary | ICD-10-CM | POA: Diagnosis not present

## 2020-09-27 DIAGNOSIS — M542 Cervicalgia: Secondary | ICD-10-CM | POA: Diagnosis not present

## 2020-09-27 DIAGNOSIS — M62522 Muscle wasting and atrophy, not elsewhere classified, left upper arm: Secondary | ICD-10-CM | POA: Diagnosis not present

## 2020-09-29 DIAGNOSIS — H353130 Nonexudative age-related macular degeneration, bilateral, stage unspecified: Secondary | ICD-10-CM | POA: Diagnosis not present

## 2020-09-29 DIAGNOSIS — H2513 Age-related nuclear cataract, bilateral: Secondary | ICD-10-CM | POA: Diagnosis not present

## 2020-09-29 DIAGNOSIS — E119 Type 2 diabetes mellitus without complications: Secondary | ICD-10-CM | POA: Diagnosis not present

## 2020-09-29 DIAGNOSIS — H04123 Dry eye syndrome of bilateral lacrimal glands: Secondary | ICD-10-CM | POA: Diagnosis not present

## 2020-09-30 DIAGNOSIS — M25512 Pain in left shoulder: Secondary | ICD-10-CM | POA: Diagnosis not present

## 2020-09-30 DIAGNOSIS — M542 Cervicalgia: Secondary | ICD-10-CM | POA: Diagnosis not present

## 2020-09-30 DIAGNOSIS — M62522 Muscle wasting and atrophy, not elsewhere classified, left upper arm: Secondary | ICD-10-CM | POA: Diagnosis not present

## 2020-10-04 DIAGNOSIS — M62522 Muscle wasting and atrophy, not elsewhere classified, left upper arm: Secondary | ICD-10-CM | POA: Diagnosis not present

## 2020-10-04 DIAGNOSIS — M542 Cervicalgia: Secondary | ICD-10-CM | POA: Diagnosis not present

## 2020-10-04 DIAGNOSIS — M25512 Pain in left shoulder: Secondary | ICD-10-CM | POA: Diagnosis not present

## 2020-10-06 DIAGNOSIS — M62522 Muscle wasting and atrophy, not elsewhere classified, left upper arm: Secondary | ICD-10-CM | POA: Diagnosis not present

## 2020-10-06 DIAGNOSIS — M542 Cervicalgia: Secondary | ICD-10-CM | POA: Diagnosis not present

## 2020-10-06 DIAGNOSIS — M25512 Pain in left shoulder: Secondary | ICD-10-CM | POA: Diagnosis not present

## 2020-10-08 DIAGNOSIS — M25512 Pain in left shoulder: Secondary | ICD-10-CM | POA: Diagnosis not present

## 2020-10-08 DIAGNOSIS — E119 Type 2 diabetes mellitus without complications: Secondary | ICD-10-CM | POA: Diagnosis not present

## 2020-10-08 DIAGNOSIS — I10 Essential (primary) hypertension: Secondary | ICD-10-CM | POA: Diagnosis not present

## 2020-10-08 DIAGNOSIS — M62522 Muscle wasting and atrophy, not elsewhere classified, left upper arm: Secondary | ICD-10-CM | POA: Diagnosis not present

## 2020-10-08 DIAGNOSIS — M542 Cervicalgia: Secondary | ICD-10-CM | POA: Diagnosis not present

## 2020-10-08 DIAGNOSIS — E7800 Pure hypercholesterolemia, unspecified: Secondary | ICD-10-CM | POA: Diagnosis not present

## 2020-10-11 DIAGNOSIS — M25512 Pain in left shoulder: Secondary | ICD-10-CM | POA: Diagnosis not present

## 2020-10-11 DIAGNOSIS — M542 Cervicalgia: Secondary | ICD-10-CM | POA: Diagnosis not present

## 2020-10-11 DIAGNOSIS — M62522 Muscle wasting and atrophy, not elsewhere classified, left upper arm: Secondary | ICD-10-CM | POA: Diagnosis not present

## 2020-10-13 DIAGNOSIS — M62522 Muscle wasting and atrophy, not elsewhere classified, left upper arm: Secondary | ICD-10-CM | POA: Diagnosis not present

## 2020-10-13 DIAGNOSIS — M542 Cervicalgia: Secondary | ICD-10-CM | POA: Diagnosis not present

## 2020-10-13 DIAGNOSIS — M25512 Pain in left shoulder: Secondary | ICD-10-CM | POA: Diagnosis not present

## 2020-10-15 DIAGNOSIS — M62522 Muscle wasting and atrophy, not elsewhere classified, left upper arm: Secondary | ICD-10-CM | POA: Diagnosis not present

## 2020-10-15 DIAGNOSIS — M25512 Pain in left shoulder: Secondary | ICD-10-CM | POA: Diagnosis not present

## 2020-10-15 DIAGNOSIS — M542 Cervicalgia: Secondary | ICD-10-CM | POA: Diagnosis not present

## 2020-10-18 DIAGNOSIS — M62522 Muscle wasting and atrophy, not elsewhere classified, left upper arm: Secondary | ICD-10-CM | POA: Diagnosis not present

## 2020-10-18 DIAGNOSIS — M25512 Pain in left shoulder: Secondary | ICD-10-CM | POA: Diagnosis not present

## 2020-10-18 DIAGNOSIS — M542 Cervicalgia: Secondary | ICD-10-CM | POA: Diagnosis not present

## 2020-10-19 DIAGNOSIS — E559 Vitamin D deficiency, unspecified: Secondary | ICD-10-CM | POA: Diagnosis not present

## 2020-10-19 DIAGNOSIS — Z0000 Encounter for general adult medical examination without abnormal findings: Secondary | ICD-10-CM | POA: Diagnosis not present

## 2020-10-19 DIAGNOSIS — I10 Essential (primary) hypertension: Secondary | ICD-10-CM | POA: Diagnosis not present

## 2020-10-19 DIAGNOSIS — F325 Major depressive disorder, single episode, in full remission: Secondary | ICD-10-CM | POA: Diagnosis not present

## 2020-10-19 DIAGNOSIS — K219 Gastro-esophageal reflux disease without esophagitis: Secondary | ICD-10-CM | POA: Diagnosis not present

## 2020-10-19 DIAGNOSIS — E1169 Type 2 diabetes mellitus with other specified complication: Secondary | ICD-10-CM | POA: Diagnosis not present

## 2020-10-19 DIAGNOSIS — E7800 Pure hypercholesterolemia, unspecified: Secondary | ICD-10-CM | POA: Diagnosis not present

## 2020-10-19 DIAGNOSIS — Z6837 Body mass index (BMI) 37.0-37.9, adult: Secondary | ICD-10-CM | POA: Diagnosis not present

## 2020-10-19 DIAGNOSIS — K760 Fatty (change of) liver, not elsewhere classified: Secondary | ICD-10-CM | POA: Diagnosis not present

## 2020-10-21 DIAGNOSIS — M25512 Pain in left shoulder: Secondary | ICD-10-CM | POA: Diagnosis not present

## 2020-10-21 DIAGNOSIS — M542 Cervicalgia: Secondary | ICD-10-CM | POA: Diagnosis not present

## 2020-10-21 DIAGNOSIS — M62522 Muscle wasting and atrophy, not elsewhere classified, left upper arm: Secondary | ICD-10-CM | POA: Diagnosis not present

## 2020-11-01 DIAGNOSIS — M542 Cervicalgia: Secondary | ICD-10-CM | POA: Diagnosis not present

## 2020-11-02 DIAGNOSIS — M25512 Pain in left shoulder: Secondary | ICD-10-CM | POA: Diagnosis not present

## 2020-11-02 DIAGNOSIS — M7552 Bursitis of left shoulder: Secondary | ICD-10-CM | POA: Diagnosis not present

## 2020-11-02 DIAGNOSIS — M7542 Impingement syndrome of left shoulder: Secondary | ICD-10-CM | POA: Diagnosis not present

## 2020-11-03 DIAGNOSIS — L853 Xerosis cutis: Secondary | ICD-10-CM | POA: Diagnosis not present

## 2020-11-13 DIAGNOSIS — M542 Cervicalgia: Secondary | ICD-10-CM | POA: Diagnosis not present

## 2020-11-18 DIAGNOSIS — M5030 Other cervical disc degeneration, unspecified cervical region: Secondary | ICD-10-CM | POA: Diagnosis not present

## 2020-12-08 DIAGNOSIS — E7800 Pure hypercholesterolemia, unspecified: Secondary | ICD-10-CM | POA: Diagnosis not present

## 2020-12-08 DIAGNOSIS — E1169 Type 2 diabetes mellitus with other specified complication: Secondary | ICD-10-CM | POA: Diagnosis not present

## 2020-12-08 DIAGNOSIS — F419 Anxiety disorder, unspecified: Secondary | ICD-10-CM | POA: Diagnosis not present

## 2020-12-08 DIAGNOSIS — I10 Essential (primary) hypertension: Secondary | ICD-10-CM | POA: Diagnosis not present

## 2020-12-17 DIAGNOSIS — H353130 Nonexudative age-related macular degeneration, bilateral, stage unspecified: Secondary | ICD-10-CM | POA: Diagnosis not present

## 2020-12-17 DIAGNOSIS — H5203 Hypermetropia, bilateral: Secondary | ICD-10-CM | POA: Diagnosis not present

## 2020-12-17 DIAGNOSIS — H52221 Regular astigmatism, right eye: Secondary | ICD-10-CM | POA: Diagnosis not present

## 2020-12-17 DIAGNOSIS — H2513 Age-related nuclear cataract, bilateral: Secondary | ICD-10-CM | POA: Diagnosis not present

## 2020-12-17 DIAGNOSIS — E119 Type 2 diabetes mellitus without complications: Secondary | ICD-10-CM | POA: Diagnosis not present

## 2021-01-20 DIAGNOSIS — K219 Gastro-esophageal reflux disease without esophagitis: Secondary | ICD-10-CM | POA: Diagnosis not present

## 2021-01-20 DIAGNOSIS — E1169 Type 2 diabetes mellitus with other specified complication: Secondary | ICD-10-CM | POA: Diagnosis not present

## 2021-01-20 DIAGNOSIS — Z6835 Body mass index (BMI) 35.0-35.9, adult: Secondary | ICD-10-CM | POA: Diagnosis not present

## 2021-01-20 DIAGNOSIS — K760 Fatty (change of) liver, not elsewhere classified: Secondary | ICD-10-CM | POA: Diagnosis not present

## 2021-01-20 DIAGNOSIS — I10 Essential (primary) hypertension: Secondary | ICD-10-CM | POA: Diagnosis not present

## 2021-01-20 DIAGNOSIS — E7800 Pure hypercholesterolemia, unspecified: Secondary | ICD-10-CM | POA: Diagnosis not present

## 2021-01-20 DIAGNOSIS — N760 Acute vaginitis: Secondary | ICD-10-CM | POA: Diagnosis not present

## 2021-02-06 DIAGNOSIS — E119 Type 2 diabetes mellitus without complications: Secondary | ICD-10-CM | POA: Diagnosis not present

## 2021-02-06 DIAGNOSIS — E7800 Pure hypercholesterolemia, unspecified: Secondary | ICD-10-CM | POA: Diagnosis not present

## 2021-02-06 DIAGNOSIS — I10 Essential (primary) hypertension: Secondary | ICD-10-CM | POA: Diagnosis not present

## 2021-02-24 DIAGNOSIS — J0100 Acute maxillary sinusitis, unspecified: Secondary | ICD-10-CM | POA: Diagnosis not present

## 2021-04-21 DIAGNOSIS — F5101 Primary insomnia: Secondary | ICD-10-CM | POA: Diagnosis not present

## 2021-04-21 DIAGNOSIS — Z6836 Body mass index (BMI) 36.0-36.9, adult: Secondary | ICD-10-CM | POA: Diagnosis not present

## 2021-04-21 DIAGNOSIS — E1169 Type 2 diabetes mellitus with other specified complication: Secondary | ICD-10-CM | POA: Diagnosis not present

## 2021-04-21 DIAGNOSIS — K760 Fatty (change of) liver, not elsewhere classified: Secondary | ICD-10-CM | POA: Diagnosis not present

## 2021-04-21 DIAGNOSIS — I10 Essential (primary) hypertension: Secondary | ICD-10-CM | POA: Diagnosis not present

## 2021-04-21 DIAGNOSIS — K219 Gastro-esophageal reflux disease without esophagitis: Secondary | ICD-10-CM | POA: Diagnosis not present

## 2021-04-21 DIAGNOSIS — E7800 Pure hypercholesterolemia, unspecified: Secondary | ICD-10-CM | POA: Diagnosis not present

## 2021-07-15 DIAGNOSIS — E119 Type 2 diabetes mellitus without complications: Secondary | ICD-10-CM | POA: Diagnosis not present

## 2021-07-15 DIAGNOSIS — H2513 Age-related nuclear cataract, bilateral: Secondary | ICD-10-CM | POA: Diagnosis not present

## 2021-07-15 DIAGNOSIS — H524 Presbyopia: Secondary | ICD-10-CM | POA: Diagnosis not present

## 2021-07-15 DIAGNOSIS — H5203 Hypermetropia, bilateral: Secondary | ICD-10-CM | POA: Diagnosis not present

## 2021-07-15 DIAGNOSIS — H353130 Nonexudative age-related macular degeneration, bilateral, stage unspecified: Secondary | ICD-10-CM | POA: Diagnosis not present

## 2021-07-15 DIAGNOSIS — H3561 Retinal hemorrhage, right eye: Secondary | ICD-10-CM | POA: Diagnosis not present

## 2021-07-21 DIAGNOSIS — Z6836 Body mass index (BMI) 36.0-36.9, adult: Secondary | ICD-10-CM | POA: Diagnosis not present

## 2021-07-21 DIAGNOSIS — E7800 Pure hypercholesterolemia, unspecified: Secondary | ICD-10-CM | POA: Diagnosis not present

## 2021-07-21 DIAGNOSIS — K760 Fatty (change of) liver, not elsewhere classified: Secondary | ICD-10-CM | POA: Diagnosis not present

## 2021-07-21 DIAGNOSIS — I10 Essential (primary) hypertension: Secondary | ICD-10-CM | POA: Diagnosis not present

## 2021-07-21 DIAGNOSIS — E1169 Type 2 diabetes mellitus with other specified complication: Secondary | ICD-10-CM | POA: Diagnosis not present

## 2021-11-16 ENCOUNTER — Other Ambulatory Visit: Payer: Self-pay | Admitting: Family Medicine

## 2021-11-16 DIAGNOSIS — Z1231 Encounter for screening mammogram for malignant neoplasm of breast: Secondary | ICD-10-CM

## 2021-11-18 ENCOUNTER — Ambulatory Visit
Admission: RE | Admit: 2021-11-18 | Discharge: 2021-11-18 | Disposition: A | Discharge status: Home / Self Care | Payer: Medicare HMO | Source: Ambulatory Visit | Attending: Family Medicine | Admitting: Family Medicine

## 2021-11-18 DIAGNOSIS — Z1231 Encounter for screening mammogram for malignant neoplasm of breast: Secondary | ICD-10-CM

## 2021-11-23 DIAGNOSIS — I10 Essential (primary) hypertension: Secondary | ICD-10-CM | POA: Diagnosis not present

## 2021-11-23 DIAGNOSIS — E7800 Pure hypercholesterolemia, unspecified: Secondary | ICD-10-CM | POA: Diagnosis not present

## 2021-11-23 DIAGNOSIS — E1169 Type 2 diabetes mellitus with other specified complication: Secondary | ICD-10-CM | POA: Diagnosis not present

## 2021-11-23 DIAGNOSIS — K219 Gastro-esophageal reflux disease without esophagitis: Secondary | ICD-10-CM | POA: Diagnosis not present

## 2021-11-23 DIAGNOSIS — Z23 Encounter for immunization: Secondary | ICD-10-CM | POA: Diagnosis not present

## 2021-11-23 DIAGNOSIS — K760 Fatty (change of) liver, not elsewhere classified: Secondary | ICD-10-CM | POA: Diagnosis not present

## 2021-11-23 DIAGNOSIS — Z1389 Encounter for screening for other disorder: Secondary | ICD-10-CM | POA: Diagnosis not present

## 2021-11-23 DIAGNOSIS — Z0000 Encounter for general adult medical examination without abnormal findings: Secondary | ICD-10-CM | POA: Diagnosis not present

## 2021-11-23 DIAGNOSIS — E559 Vitamin D deficiency, unspecified: Secondary | ICD-10-CM | POA: Diagnosis not present

## 2022-01-24 DIAGNOSIS — H3561 Retinal hemorrhage, right eye: Secondary | ICD-10-CM | POA: Diagnosis not present

## 2022-01-24 DIAGNOSIS — H353131 Nonexudative age-related macular degeneration, bilateral, early dry stage: Secondary | ICD-10-CM | POA: Diagnosis not present

## 2022-01-24 DIAGNOSIS — E119 Type 2 diabetes mellitus without complications: Secondary | ICD-10-CM | POA: Diagnosis not present

## 2022-01-24 DIAGNOSIS — H524 Presbyopia: Secondary | ICD-10-CM | POA: Diagnosis not present

## 2022-01-24 DIAGNOSIS — H353130 Nonexudative age-related macular degeneration, bilateral, stage unspecified: Secondary | ICD-10-CM | POA: Diagnosis not present

## 2022-01-24 DIAGNOSIS — H2513 Age-related nuclear cataract, bilateral: Secondary | ICD-10-CM | POA: Diagnosis not present

## 2022-01-27 DIAGNOSIS — M8589 Other specified disorders of bone density and structure, multiple sites: Secondary | ICD-10-CM | POA: Diagnosis not present

## 2022-02-23 DIAGNOSIS — K760 Fatty (change of) liver, not elsewhere classified: Secondary | ICD-10-CM | POA: Diagnosis not present

## 2022-02-23 DIAGNOSIS — I10 Essential (primary) hypertension: Secondary | ICD-10-CM | POA: Diagnosis not present

## 2022-02-23 DIAGNOSIS — Z86010 Personal history of colonic polyps: Secondary | ICD-10-CM | POA: Diagnosis not present

## 2022-02-23 DIAGNOSIS — Z23 Encounter for immunization: Secondary | ICD-10-CM | POA: Diagnosis not present

## 2022-02-23 DIAGNOSIS — Z6837 Body mass index (BMI) 37.0-37.9, adult: Secondary | ICD-10-CM | POA: Diagnosis not present

## 2022-02-23 DIAGNOSIS — E7800 Pure hypercholesterolemia, unspecified: Secondary | ICD-10-CM | POA: Diagnosis not present

## 2022-02-23 DIAGNOSIS — E1169 Type 2 diabetes mellitus with other specified complication: Secondary | ICD-10-CM | POA: Diagnosis not present

## 2022-03-01 ENCOUNTER — Encounter: Payer: Self-pay | Admitting: Gastroenterology

## 2022-04-26 ENCOUNTER — Ambulatory Visit: Payer: Medicare HMO | Admitting: Gastroenterology

## 2022-04-26 ENCOUNTER — Encounter: Payer: Self-pay | Admitting: Gastroenterology

## 2022-04-26 VITALS — BP 120/70 | HR 72 | Ht 64.0 in | Wt 212.0 lb

## 2022-04-26 DIAGNOSIS — K581 Irritable bowel syndrome with constipation: Secondary | ICD-10-CM

## 2022-04-26 DIAGNOSIS — Z86010 Personal history of colonic polyps: Secondary | ICD-10-CM

## 2022-04-26 DIAGNOSIS — E785 Hyperlipidemia, unspecified: Secondary | ICD-10-CM | POA: Diagnosis not present

## 2022-04-26 DIAGNOSIS — R1319 Other dysphagia: Secondary | ICD-10-CM

## 2022-04-26 DIAGNOSIS — K219 Gastro-esophageal reflux disease without esophagitis: Secondary | ICD-10-CM

## 2022-04-26 DIAGNOSIS — K2100 Gastro-esophageal reflux disease with esophagitis, without bleeding: Secondary | ICD-10-CM

## 2022-04-26 DIAGNOSIS — K760 Fatty (change of) liver, not elsewhere classified: Secondary | ICD-10-CM | POA: Diagnosis not present

## 2022-04-26 DIAGNOSIS — E119 Type 2 diabetes mellitus without complications: Secondary | ICD-10-CM

## 2022-04-26 MED ORDER — ESOMEPRAZOLE MAGNESIUM 40 MG PO CPDR: 40.0000 mg | DELAYED_RELEASE_CAPSULE | Freq: Every day | ORAL | 4 refills | Status: DC

## 2022-04-26 MED ORDER — CLENPIQ 10-3.5-12 MG-GM -GM/175ML PO SOLN: 1.0000 | ORAL | 0 refills | Status: DC

## 2022-04-26 NOTE — Progress Notes (Signed)
Chief Complaint: For GI workup  Referring Provider:  Alinda Deem, MD      ASSESSMENT AND PLAN;   #1. GERD with eso dysphagia  #2. H/O polyps/FH CRC (mom in mid 18s)  #3. IBS-C  #4. Fatty liver. With assoc DM2, obesity, HLD.  Negative acute hepatitis panel 08/2013.  No ETOH   Plan: -EGD with dil/colon -Continue nexium 40mg  po QD #90, 4RF -USE -Discussed wt loss. -Obtain recent blood work from Dr. Len Childs office -Continue stool softeners/Dulcolax.  Increase water intake.   I discussed EGD/Colonoscopy- the indications, risks, alternatives and potential complications including, but not limited to bleeding, infection, reaction to meds, damage to internal organs, cardiac and/or pulmonary problems, and perforation requiring surgery. The possibility that significant findings could be missed was explained. All ? were answered. Pt consents to proceed. HPI:    Carolyn Arnold is a 67 y.o. female  With multiple medical problems as below including DM2, fibromyalgia, GERD, HTN, HLD, history of kidney stones, obesity, IBS, fatty liver, s/p cholecystectomy/hysterectomy  Here for intermittent solid food dysphagia x 2 yrs, mostly in lower neck/upper chest, intermittent, mostly with meats She has associated heartburn for which she takes Nexium on as-needed basis History of EGD by Dr. Jennye Boroughs 2008. No odynophagia or dysphagia.  Longstanding history of constipation- BM 1-2/week with associated abdominal bloating, Dx with IBS Stool softner with Dulcolax does help.  Fatty liver-recent LFTs by Dr. Jacinto Reap patient were normal.  Lost 20lb when she had COVID.  Wt Readings from Last 3 Encounters:  04/26/22 212 lb (96.2 kg)  08/27/17 217 lb (98.4 kg)  08/20/17 217 lb (98.4 kg)   No H/O itching, skin lesions, easy bruisability, intake of OTC meds including diet pills, herbal medications, anabolic steroids or Tylenol. There is no H/O blood transfusions, IVDA or FH of liver disease. No  jaundice, dark urine or pale stools. No alcohol abuse.   Past GI workup: Colonoscopy 01/31/2013 ([facility]) -Colonic polyp s/p polypectomy. Bx-tubular adenoma -Mild sigmoid diverticulosis -Internal hemorrhoids -Repeat in 5 years.  She did get letters.  Unfortunately, her husband was having significant medical issues-hence she did not schedule  EGD by Dr. Jennye Boroughs 2008  [place] 11/2013 showing fatty liver.    SH- husband-CABG Past Medical History:  Diagnosis Date   ALLERGIC RHINITIS 07/20/2009   Qualifier: Diagnosis of  By: Delton Coombes MD, [place]    Asthma    INTERMIETTENT ASTHMA USES INHALER PRN   Calcium deposit in bursa of elbow, left    AND RIGHT "PSEUDO GOUT"   Carpal tunnel syndrome    BILATERAL   Chronic lower back pain    Diet-controlled type 2 diabetes mellitus    Dysrhythmia    HEART PAPITATIONS   Family history of adverse reaction to anesthesia    SISTER TROUBLE WAKING UP   FATTY LIVER DISEASE, HX OF 10/29/2008   Qualifier: Diagnosis of  By: Denyse Amass, CMA, Carol     Fibromyalgia    GERD (gastroesophageal reflux disease)    Gout    Hyperlipidemia    Hypertension    IBS (irritable bowel syndrome)    Intermittent constipation    Mitral valve prolapse    MVP (mitral valve prolapse)    Osteopenia    Pancreatitis 20 YRS AGO   Pericardial effusion    Chronic   Renal stones    SCARRING INSIDE BOTH KIDNEYS PER PATIENT    Past Surgical History:  Procedure Laterality Date   CHOLECYSTECTOMY  1993   LAPAROSCOPIC  IN [place]   ELBOW FRACTURE SURGERY Right 1973   KNEE ARTHROSCOPY Right 2017   LUMBAR LAMINECTOMY  1998 & 2008   2 OR 3 RAY CARGES LOWER BACK WITH TITANOIM PLATES AND SCREWS PER PT   PARTIAL KNEE ARTHROPLASTY Right 08/27/2017   Procedure: UNICOMPARTMENTAL RIGHT KNEE;  Surgeon: Dannielle Huh, MD;  Location: [facility];  Service: Orthopedics;  Laterality: Right;  Adductor Block   right shoulder clavical reduction for arthritis  2016   SHOULDER SURGERY Left    by Dr.  Thedore Mins for bone spurs   TONSILLECTOMY AND ADENOIDECTOMY  AS CHILD   TOTAL ABDOMINAL HYSTERECTOMY W/ BILATERAL SALPINGOOPHORECTOMY  1988   for endometriosis    Family History  Problem Relation Age of Onset   Coronary artery disease Mother    Breast cancer Mother    Colon cancer Mother    Lung cancer Mother    COPD Mother    COPD Father    Hypertension Sister    Hyperlipidemia Sister    Breast cancer Sister    Diabetes Sister    Breast cancer Daughter    Breast cancer Maternal Aunt    Coronary artery disease Brother    Hyperlipidemia Brother     Social History   Tobacco Use   Smoking status: Never   Smokeless tobacco: Never  Vaping Use   Vaping Use: Never used  Substance Use Topics   Alcohol use: Never   Drug use: Never    Current Outpatient Medications  Medication Sig Dispense Refill   albuterol (PROVENTIL HFA;VENTOLIN HFA) 108 (90 Base) MCG/ACT inhaler Inhale 1-2 puffs into the lungs every 6 (six) hours as needed for wheezing or shortness of breath.     allopurinol (ZYLOPRIM) 300 MG tablet Take 300 mg by mouth at bedtime.      aspirin 325 MG tablet Take 1 tablet (325 mg total) by mouth 2 (two) times daily. 30 tablet 0   atenolol (TENORMIN) 50 MG tablet Take 50 mg by mouth at bedtime.      cyclobenzaprine (FLEXERIL) 10 MG tablet Take 10 mg by mouth 3 (three) times daily as needed ((typically scheduled twice daily)). For back pain     Dentifrices (BIOTENE DRY MOUTH DT) Place 1 spray onto teeth 3 (three) times daily as needed (for dry mouth).     diazepam (VALIUM) 5 MG tablet Take 5 mg by mouth 2 (two) times daily as needed for anxiety.      esomeprazole (NEXIUM) 40 MG capsule Take 40 mg by mouth at bedtime.      fluticasone (FLONASE) 50 MCG/ACT nasal spray Place 2 sprays into both nostrils daily.     gabapentin (NEURONTIN) 300 MG capsule Take 900 mg by mouth 2 (two) times daily.      gentamicin ointment (GARAMYCIN) 0.1 % Apply 1 application topically 3 (three) times daily  as needed (for skin rash).      hydroxypropyl methylcellulose / hypromellose (ISOPTO TEARS / GONIOVISC) 2.5 % ophthalmic solution Place 1 drop into both eyes 3 (three) times daily as needed for dry eyes.     hydrOXYzine (ATARAX/VISTARIL) 10 MG tablet Take 10 mg by mouth every 6 (six) hours as needed for itching.     hyoscyamine (LEVSIN SL) 0.125 MG SL tablet Place 0.125 mg under the tongue 3 (three) times daily as needed (for abdominal pain (IBS)).      meclizine (ANTIVERT) 25 MG tablet Take 25 mg by mouth every 6 (six) hours as needed for dizziness.  milk thistle 175 MG tablet Take 175 mg by mouth at bedtime.     Oxycodone HCl 10 MG TABS Take 1-2 tablets (10-20 mg total) by mouth every 6 (six) hours as needed. 40 tablet 0   polyethylene glycol (MIRALAX / GLYCOLAX) packet Take 17 g by mouth daily as needed (for constipation.).      pravastatin (PRAVACHOL) 40 MG tablet Take 40 mg by mouth at bedtime.     triamcinolone cream (KENALOG) 0.1 % Apply 1 application topically 2 (two) times daily as needed (For itching and rash).      cholecalciferol (VITAMIN D) 1000 units tablet Take 3,000 Units by mouth at bedtime.     sertraline (ZOLOFT) 100 MG tablet Take 150 mg by mouth at bedtime.     No current facility-administered medications for this visit.    Allergies  Allergen Reactions   Ambien [Zolpidem Tartrate] Other (See Comments)    Irregular rhythm   Sulfonamide Derivatives Hives and Rash   Welchol [Colesevelam Hcl] Other (See Comments)    Liver function abnormalities   Zolpidem Tartrate    Celecoxib Hives and Rash   Cox-2 Inhibitors Rash and Hypertension   Cymbalta [Duloxetine Hcl] Nausea And Vomiting   Mobic [Meloxicam] Other (See Comments)    Heartburn   Rofecoxib Hives and Rash    (Vioxx)   Trazodone And Nefazodone Palpitations   Zyprexa [Olanzapine] Palpitations    Review of Systems:  Constitutional: Denies fever, chills, diaphoresis, appetite change and fatigue.  HEENT:  Denies photophobia, eye pain, redness, hearing loss, ear pain, congestion, sore throat, rhinorrhea, sneezing, mouth sores, neck pain, neck stiffness and tinnitus.   Respiratory: Denies SOB, DOE, cough, chest tightness,  and wheezing.   Cardiovascular: Denies chest pain, palpitations and leg swelling.  Genitourinary: Denies dysuria, urgency, frequency, hematuria, flank pain and difficulty urinating.  Musculoskeletal: has myalgias, has back pain, joint swelling, arthralgias and gait problem.  Skin: No rash.  Neurological: Denies dizziness, seizures, syncope, weakness, light-headedness, numbness and has headaches.  Hematological: Denies adenopathy. Easy bruising, personal or family bleeding history  Psychiatric/Behavioral: No anxiety or depression.  Has sleeping problems.     Physical Exam:    BP 120/70   Pulse 72   Ht 5\' 4"  (1.626 m)   Wt 212 lb (96.2 kg)   BMI 36.39 kg/m  Wt Readings from Last 3 Encounters:  04/26/22 212 lb (96.2 kg)  08/27/17 217 lb (98.4 kg)  08/20/17 217 lb (98.4 kg)   Constitutional:  Well-developed, in no acute distress. Psychiatric: Normal mood and affect. Behavior is normal. HEENT: Pupils normal.  Conjunctivae are normal. No scleral icterus. Cardiovascular: Normal rate, regular rhythm. No edema Pulmonary/chest: Effort normal and breath sounds normal. No wheezing, rales or rhonchi. Abdominal: Soft, nondistended. Nontender. Bowel sounds active throughout. There are no masses palpable. No hepatomegaly. Rectal: Deferred Neurological: Alert and oriented to person place and time. Skin: Skin is warm and dry. No rashes noted.  Data Reviewed: I have personally reviewed following labs and imaging studies  CBC:    Latest Ref Rng & Units 08/20/2017   12:45 PM  CBC  WBC 4.0 - 10.5 K/uL 6.7   Hemoglobin 12.0 - 15.0 g/dL 84.6   Hematocrit 96.2 - 46.0 % 40.3   Platelets 150 - 400 K/uL 186     CMP:    Latest Ref Rng & Units 08/20/2017   12:45 PM 06/06/2007     3:32 PM  CMP  Glucose 70 - 99 mg/dL 952  117   BUN 8 - 23 mg/dL 15  11   Creatinine 4.40 - 1.00 mg/dL 1.02  0.9   Sodium 725 - 145 mmol/L 142  140   Potassium 3.5 - 5.1 mmol/L 4.9  3.2   Chloride 98 - 111 mmol/L 105  103   CO2 22 - 32 mmol/L 30  29   Calcium 8.9 - 10.3 mg/dL 9.6  9.3   Total Protein 6.5 - 8.1 g/dL 7.6  7.7   Total Bilirubin 0.3 - 1.2 mg/dL 0.7  1.1   Alkaline Phos 38 - 126 U/L 124  122   AST 15 - 41 U/L 68  182   ALT 0 - 44 U/L 48  171        Edman Circle, MD 04/26/2022, 11:04 AM  Cc: Alinda Deem, MD

## 2022-04-26 NOTE — Patient Instructions (Addendum)
_______________________________________________________  If your blood pressure at your visit was 140/90 or greater, please contact your primary care physician to follow up on this.  _______________________________________________________  If you are age 69 or older, your body mass index should be between 23-30. Your Body mass index is 36.39 kg/m. If this is out of the aforementioned range listed, please consider follow up with your Primary Care Provider.  If you are age 42 or younger, your body mass index should be between 19-25. Your Body mass index is 36.39 kg/m. If this is out of the aformentioned range listed, please consider follow up with your Primary Care Provider.   ________________________________________________________  The [place] [facility] providers would like to encourage you to use [facility] to communicate with providers for non-urgent requests or questions.  Due to long hold times on the telephone, sending your provider a message by [facility] may be a faster and more efficient way to get a response.  Please allow 48 business hours for a response.  Please remember that this is for non-urgent requests.  _______________________________________________________  Carolyn Arnold have been scheduled for an endoscopy and colonoscopy. Please follow the written instructions given to you at your visit today. Please pick up your prep supplies at the pharmacy within the next 1-3 days. If you use inhalers (even only as needed), please bring them with you on the day of your procedure.  You have been scheduled for an abdominal ultrasound at [facility] (1st floor of hospital) on 05-04-2022 at 10am. Please arrive 30 minutes prior to your appointment for registration. Make certain not to have anything to eat or drink midnight prior to your appointment. Should you need to reschedule your appointment, please contact radiology at [phone]. This test typically takes about 30 minutes to perform.  We have  sent the following medications to your pharmacy for you to pick up at your convenience: Nexium-mailorder [email or web address]  Thank you,  Dr. Lynann Bologna

## 2022-05-04 ENCOUNTER — Ambulatory Visit (HOSPITAL_COMMUNITY)
Admission: RE | Admit: 2022-05-04 | Discharge: 2022-05-04 | Disposition: A | Discharge status: Home / Self Care | Payer: Medicare HMO | Source: Ambulatory Visit | Attending: Gastroenterology | Admitting: Gastroenterology

## 2022-05-04 DIAGNOSIS — K760 Fatty (change of) liver, not elsewhere classified: Secondary | ICD-10-CM | POA: Diagnosis not present

## 2022-05-08 ENCOUNTER — Other Ambulatory Visit: Payer: Self-pay

## 2022-05-08 DIAGNOSIS — K760 Fatty (change of) liver, not elsewhere classified: Secondary | ICD-10-CM

## 2022-06-08 ENCOUNTER — Encounter: Payer: Self-pay | Admitting: Gastroenterology

## 2022-06-13 ENCOUNTER — Other Ambulatory Visit (INDEPENDENT_AMBULATORY_CARE_PROVIDER_SITE_OTHER): Payer: Medicare HMO

## 2022-06-13 DIAGNOSIS — K760 Fatty (change of) liver, not elsewhere classified: Secondary | ICD-10-CM | POA: Diagnosis not present

## 2022-06-13 LAB — CBC WITH DIFFERENTIAL/PLATELET
Basophils Absolute: 0.1 10*3/uL (ref 0.0–0.1)
Basophils Relative: 1.4 % (ref 0.0–3.0)
Eosinophils Absolute: 0.4 10*3/uL (ref 0.0–0.7)
Eosinophils Relative: 6.2 % — ABNORMAL HIGH (ref 0.0–5.0)
HCT: 40.5 % (ref 36.0–46.0)
Hemoglobin: 13.2 g/dL (ref 12.0–15.0)
Lymphocytes Relative: 33.8 % (ref 12.0–46.0)
Lymphs Abs: 2.1 10*3/uL (ref 0.7–4.0)
MCHC: 32.7 g/dL (ref 30.0–36.0)
MCV: 93 fl (ref 78.0–100.0)
Monocytes Absolute: 0.3 10*3/uL (ref 0.1–1.0)
Monocytes Relative: 5.5 % (ref 3.0–12.0)
Neutro Abs: 3.4 10*3/uL (ref 1.4–7.7)
Neutrophils Relative %: 53.1 % (ref 43.0–77.0)
Platelets: 185 10*3/uL (ref 150.0–400.0)
RBC: 4.35 Mil/uL (ref 3.87–5.11)
RDW: 14.5 % (ref 11.5–15.5)
WBC: 6.3 10*3/uL (ref 4.0–10.5)

## 2022-06-13 LAB — PROTIME-INR
INR: 1.1 ratio — ABNORMAL HIGH (ref 0.8–1.0)
Prothrombin Time: 11.9 s (ref 9.6–13.1)

## 2022-06-13 LAB — COMPREHENSIVE METABOLIC PANEL
ALT: 50 U/L — ABNORMAL HIGH (ref 0–35)
AST: 62 U/L — ABNORMAL HIGH (ref 0–37)
Albumin: 4.2 g/dL (ref 3.5–5.2)
Alkaline Phosphatase: 98 U/L (ref 39–117)
BUN: 14 mg/dL (ref 6–23)
CO2: 29 mEq/L (ref 19–32)
Calcium: 9.7 mg/dL (ref 8.4–10.5)
Chloride: 102 mEq/L (ref 96–112)
Creatinine, Ser: 1.04 mg/dL (ref 0.40–1.20)
GFR: 54.97 mL/min — ABNORMAL LOW (ref >60.00)
Glucose, Bld: 120 mg/dL — ABNORMAL HIGH (ref 70–99)
Potassium: 3.9 mEq/L (ref 3.5–5.1)
Sodium: 138 mEq/L (ref 135–145)
Total Bilirubin: 0.8 mg/dL (ref 0.2–1.2)
Total Protein: 7.4 g/dL (ref 6.0–8.3)

## 2022-06-22 ENCOUNTER — Encounter: Payer: Self-pay | Admitting: Gastroenterology

## 2022-06-22 ENCOUNTER — Ambulatory Visit (AMBULATORY_SURGERY_CENTER): Payer: Medicare HMO | Admitting: Gastroenterology

## 2022-06-22 VITALS — BP 106/58 | HR 64 | Temp 98.0°F | Resp 18 | Ht 64.0 in | Wt 212.0 lb

## 2022-06-22 DIAGNOSIS — K2289 Other specified disease of esophagus: Secondary | ICD-10-CM | POA: Diagnosis not present

## 2022-06-22 DIAGNOSIS — Z86010 Personal history of colonic polyps: Secondary | ICD-10-CM | POA: Diagnosis not present

## 2022-06-22 DIAGNOSIS — Z09 Encounter for follow-up examination after completed treatment for conditions other than malignant neoplasm: Secondary | ICD-10-CM | POA: Diagnosis not present

## 2022-06-22 DIAGNOSIS — K219 Gastro-esophageal reflux disease without esophagitis: Secondary | ICD-10-CM

## 2022-06-22 DIAGNOSIS — E119 Type 2 diabetes mellitus without complications: Secondary | ICD-10-CM | POA: Diagnosis not present

## 2022-06-22 DIAGNOSIS — I10 Essential (primary) hypertension: Secondary | ICD-10-CM | POA: Diagnosis not present

## 2022-06-22 DIAGNOSIS — K760 Fatty (change of) liver, not elsewhere classified: Secondary | ICD-10-CM | POA: Diagnosis not present

## 2022-06-22 DIAGNOSIS — Z1211 Encounter for screening for malignant neoplasm of colon: Secondary | ICD-10-CM | POA: Diagnosis not present

## 2022-06-22 MED ORDER — SODIUM CHLORIDE 0.9 % IV SOLN
500.0000 mL | Freq: Once | INTRAVENOUS | Status: DC
Start: 2022-06-22 — End: 2022-06-22

## 2022-06-22 NOTE — Progress Notes (Signed)
Called to room to assist during endoscopic procedure.  Patient ID and intended procedure confirmed with present staff. Received instructions for my participation in the procedure from the performing physician.  

## 2022-06-22 NOTE — Progress Notes (Signed)
Pt resting comfortably. VSS. Airway intact. SBAR complete to RN. All questions answered.   

## 2022-06-22 NOTE — Progress Notes (Signed)
Chief Complaint: For GI workup  Referring Provider:  Alinda Deem, MD      ASSESSMENT AND PLAN;   #1. GERD with eso dysphagia  #2. H/O polyps/FH CRC (mom in mid 3s)  #3. IBS-C  #4. Fatty liver. With assoc DM2, obesity, HLD.  Negative acute hepatitis panel 08/2013.  No ETOH   Plan: -EGD with dil/colon -Continue nexium 40mg  po QD #90, 4RF -USE -Discussed wt loss. -Obtain recent blood work from Dr. Len Childs office -Continue stool softeners/Dulcolax.  Increase water intake.   I discussed EGD/Colonoscopy- the indications, risks, alternatives and potential complications including, but not limited to bleeding, infection, reaction to meds, damage to internal organs, cardiac and/or pulmonary problems, and perforation requiring surgery. The possibility that significant findings could be missed was explained. All ? were answered. Pt consents to proceed. HPI:    Carolyn Arnold is a 67 y.o. female  With multiple medical problems as below including DM2, fibromyalgia, GERD, HTN, HLD, history of kidney stones, obesity, IBS, fatty liver, s/p cholecystectomy/hysterectomy  Here for intermittent solid food dysphagia x 2 yrs, mostly in lower neck/upper chest, intermittent, mostly with meats She has associated heartburn for which she takes Nexium on as-needed basis History of EGD by Dr. Jennye Boroughs 2008. No odynophagia or dysphagia.  Longstanding history of constipation- BM 1-2/week with associated abdominal bloating, Dx with IBS Stool softner with Dulcolax does help.  Fatty liver-recent LFTs by Dr. Jacinto Reap patient were normal.  Lost 20lb when she had COVID.  Wt Readings from Last 3 Encounters:  06/22/22 212 lb (96.2 kg)  04/26/22 212 lb (96.2 kg)  08/27/17 217 lb (98.4 kg)   No H/O itching, skin lesions, easy bruisability, intake of OTC meds including diet pills, herbal medications, anabolic steroids or Tylenol. There is no H/O blood transfusions, IVDA or FH of liver disease. No  jaundice, dark urine or pale stools. No alcohol abuse.   Past GI workup: Colonoscopy 01/31/2013 ([facility]) -Colonic polyp s/p polypectomy. Bx-tubular adenoma -Mild sigmoid diverticulosis -Internal hemorrhoids -Repeat in 5 years.  She did get letters.  Unfortunately, her husband was having significant medical issues-hence she did not schedule  EGD by Dr. Jennye Boroughs 2008  [place] 11/2013 showing fatty liver.    SH- husband-CABG Past Medical History:  Diagnosis Date   ALLERGIC RHINITIS 07/20/2009   Qualifier: Diagnosis of  By: Delton Coombes MD, [place]    Asthma    INTERMIETTENT ASTHMA USES INHALER PRN   Calcium deposit in bursa of elbow, left    AND RIGHT "PSEUDO GOUT"   Carpal tunnel syndrome    BILATERAL   Chronic lower back pain    Diet-controlled type 2 diabetes mellitus (HCC)    Dysrhythmia    HEART PAPITATIONS   Family history of adverse reaction to anesthesia    SISTER TROUBLE WAKING UP   FATTY LIVER DISEASE, HX OF 10/29/2008   Qualifier: Diagnosis of  By: Denyse Amass, CMA, Carol     Fibromyalgia    GERD (gastroesophageal reflux disease)    Gout    Hyperlipidemia    Hypertension    IBS (irritable bowel syndrome)    Intermittent constipation    Mitral valve prolapse    MVP (mitral valve prolapse)    Osteopenia    Pancreatitis 20 YRS AGO   Pericardial effusion    Chronic   Renal stones    SCARRING INSIDE BOTH KIDNEYS PER PATIENT    Past Surgical History:  Procedure Laterality Date   CHOLECYSTECTOMY  1993  LAPAROSCOPIC IN [place]   ELBOW FRACTURE SURGERY Right 1973   KNEE ARTHROSCOPY Right 2017   LUMBAR LAMINECTOMY  1998 & 2008   2 OR 3 RAY CARGES LOWER BACK WITH TITANOIM PLATES AND SCREWS PER PT   PARTIAL KNEE ARTHROPLASTY Right 08/27/2017   Procedure: UNICOMPARTMENTAL RIGHT KNEE;  Surgeon: Dannielle Huh, MD;  Location: [facility];  Service: Orthopedics;  Laterality: Right;  Adductor Block   right shoulder clavical reduction for arthritis  2016   SHOULDER SURGERY Left    by  Dr. Thedore Mins for bone spurs   TONSILLECTOMY AND ADENOIDECTOMY  AS CHILD   TOTAL ABDOMINAL HYSTERECTOMY W/ BILATERAL SALPINGOOPHORECTOMY  1988   for endometriosis    Family History  Problem Relation Age of Onset   Coronary artery disease Mother    Breast cancer Mother    Colon cancer Mother    Lung cancer Mother    COPD Mother    COPD Father    Hypertension Sister    Hyperlipidemia Sister    Breast cancer Sister    Diabetes Sister    Breast cancer Daughter    Breast cancer Maternal Aunt    Coronary artery disease Brother    Hyperlipidemia Brother     Social History   Tobacco Use   Smoking status: Never   Smokeless tobacco: Never  Vaping Use   Vaping Use: Never used  Substance Use Topics   Alcohol use: Never   Drug use: Never    Current Outpatient Medications  Medication Sig Dispense Refill   allopurinol (ZYLOPRIM) 300 MG tablet Take 300 mg by mouth at bedtime.      atenolol (TENORMIN) 50 MG tablet Take 50 mg by mouth at bedtime.      cyclobenzaprine (FLEXERIL) 10 MG tablet Take 10 mg by mouth 3 (three) times daily as needed ((typically scheduled twice daily)). For back pain     diazepam (VALIUM) 5 MG tablet Take 5 mg by mouth 2 (two) times daily as needed for anxiety.      esomeprazole (NEXIUM) 40 MG capsule Take 40 mg by mouth at bedtime.      esomeprazole (NEXIUM) 40 MG capsule Take 1 capsule (40 mg total) by mouth daily. 90 capsule 4   gabapentin (NEURONTIN) 300 MG capsule Take 900 mg by mouth 2 (two) times daily.      gentamicin ointment (GARAMYCIN) 0.1 % Apply 1 application topically 3 (three) times daily as needed (for skin rash).      Oxycodone HCl 10 MG TABS Take 1-2 tablets (10-20 mg total) by mouth every 6 (six) hours as needed. 40 tablet 0   pravastatin (PRAVACHOL) 40 MG tablet Take 40 mg by mouth at bedtime.     triamcinolone cream (KENALOG) 0.1 % Apply 1 application topically 2 (two) times daily as needed (For itching and rash).      albuterol (PROVENTIL  HFA;VENTOLIN HFA) 108 (90 Base) MCG/ACT inhaler Inhale 1-2 puffs into the lungs every 6 (six) hours as needed for wheezing or shortness of breath.     aspirin 325 MG tablet Take 1 tablet (325 mg total) by mouth 2 (two) times daily. (Patient not taking: Reported on 06/22/2022) 30 tablet 0   Dentifrices (BIOTENE DRY MOUTH DT) Place 1 spray onto teeth 3 (three) times daily as needed (for dry mouth). (Patient not taking: Reported on 06/22/2022)     fluticasone (FLONASE) 50 MCG/ACT nasal spray Place 2 sprays into both nostrils daily.     hydrOXYzine (ATARAX/VISTARIL) 10 MG  tablet Take 10 mg by mouth every 6 (six) hours as needed for itching.     hyoscyamine (LEVSIN SL) 0.125 MG SL tablet Place 0.125 mg under the tongue 3 (three) times daily as needed (for abdominal pain (IBS)).  (Patient not taking: Reported on 06/22/2022)     meclizine (ANTIVERT) 25 MG tablet Take 25 mg by mouth every 6 (six) hours as needed for dizziness.      polyethylene glycol (MIRALAX / GLYCOLAX) packet Take 17 g by mouth daily as needed (for constipation.).      Current Facility-Administered Medications  Medication Dose Route Frequency Provider Last Rate Last Admin   0.9 %  sodium chloride infusion  500 mL Intravenous Once Lynann Bologna, MD        Allergies  Allergen Reactions   Ambien [Zolpidem Tartrate] Other (See Comments)    Irregular rhythm   Sulfonamide Derivatives Hives and Rash   Welchol [Colesevelam Hcl] Other (See Comments)    Liver function abnormalities   Zolpidem Tartrate    Celecoxib Hives and Rash   Cox-2 Inhibitors Rash and Hypertension   Cymbalta [Duloxetine Hcl] Nausea And Vomiting   Mobic [Meloxicam] Other (See Comments)    Heartburn   Rofecoxib Hives and Rash    (Vioxx)   Trazodone And Nefazodone Palpitations   Zyprexa [Olanzapine] Palpitations    Review of Systems:  Constitutional: Denies fever, chills, diaphoresis, appetite change and fatigue.  HEENT: Denies photophobia, eye pain, redness,  hearing loss, ear pain, congestion, sore throat, rhinorrhea, sneezing, mouth sores, neck pain, neck stiffness and tinnitus.   Respiratory: Denies SOB, DOE, cough, chest tightness,  and wheezing.   Cardiovascular: Denies chest pain, palpitations and leg swelling.  Genitourinary: Denies dysuria, urgency, frequency, hematuria, flank pain and difficulty urinating.  Musculoskeletal: has myalgias, has back pain, joint swelling, arthralgias and gait problem.  Skin: No rash.  Neurological: Denies dizziness, seizures, syncope, weakness, light-headedness, numbness and has headaches.  Hematological: Denies adenopathy. Easy bruising, personal or family bleeding history  Psychiatric/Behavioral: No anxiety or depression.  Has sleeping problems.     Physical Exam:    BP (!) 152/92   Pulse 65   Temp 98 F (36.7 C)   Resp 14   Ht 5\' 4"  (1.626 m)   Wt 212 lb (96.2 kg)   SpO2 100%   BMI 36.39 kg/m  Wt Readings from Last 3 Encounters:  06/22/22 212 lb (96.2 kg)  04/26/22 212 lb (96.2 kg)  08/27/17 217 lb (98.4 kg)   Constitutional:  Well-developed, in no acute distress. Psychiatric: Normal mood and affect. Behavior is normal. HEENT: Pupils normal.  Conjunctivae are normal. No scleral icterus. Cardiovascular: Normal rate, regular rhythm. No edema Pulmonary/chest: Effort normal and breath sounds normal. No wheezing, rales or rhonchi. Abdominal: Soft, nondistended. Nontender. Bowel sounds active throughout. There are no masses palpable. No hepatomegaly. Rectal: Deferred Neurological: Alert and oriented to person place and time. Skin: Skin is warm and dry. No rashes noted.  Data Reviewed: I have personally reviewed following labs and imaging studies  CBC:    Latest Ref Rng & Units 06/13/2022   11:22 AM 08/20/2017   12:45 PM  CBC  WBC 4.0 - 10.5 K/uL 6.3  6.7   Hemoglobin 12.0 - 15.0 g/dL 96.0  45.4   Hematocrit 36.0 - 46.0 % 40.5  40.3   Platelets 150.0 - 400.0 K/uL 185.0  186     CMP:     Latest Ref Rng & Units 06/13/2022   11:22  AM 08/20/2017   12:45 PM 06/06/2007    3:32 PM  CMP  Glucose 70 - 99 mg/dL 161  096  045   BUN 6 - 23 mg/dL 14  15  11    Creatinine 0.40 - 1.20 mg/dL 4.09  8.11  0.9   Sodium 135 - 145 mEq/L 138  142  140   Potassium 3.5 - 5.1 mEq/L 3.9  4.9  3.2   Chloride 96 - 112 mEq/L 102  105  103   CO2 19 - 32 mEq/L 29  30  29    Calcium 8.4 - 10.5 mg/dL 9.7  9.6  9.3   Total Protein 6.0 - 8.3 g/dL 7.4  7.6  7.7   Total Bilirubin 0.2 - 1.2 mg/dL 0.8  0.7  1.1   Alkaline Phos 39 - 117 U/L 98  124  122   AST 0 - 37 U/L 62  68  182   ALT 0 - 35 U/L 50  48  171        Edman Circle, MD 06/22/2022, 1:31 PM  Cc: Alinda Deem, MD

## 2022-06-22 NOTE — Op Note (Signed)
[facility] Patient Name: Carolyn Arnold Procedure Date: 06/22/2022 1:29 PM MRN: [id number] Endoscopist: Lynann Bologna , MD, [id number] Age: 67 Referring MD:  Date of Birth: May 05, 1955 Gender: Female Account #: [id number] Procedure:                Colonoscopy Indications:              High risk colon cancer surveillance: Personal                            history of colonic polyps. FH CRC (mom at 49) Medicines:                Monitored Anesthesia Care Procedure:                Pre-Anesthesia Assessment:                           - Prior to the procedure, a History and Physical                            was performed, and patient medications and                            allergies were reviewed. The patient's tolerance of                            previous anesthesia was also reviewed. The risks                            and benefits of the procedure and the sedation                            options and risks were discussed with the patient.                            All questions were answered, and informed consent                            was obtained. Prior Anticoagulants: The patient has                            taken no anticoagulant or antiplatelet agents. ASA                            Grade Assessment: III - A patient with severe                            systemic disease. After reviewing the risks and                            benefits, the patient was deemed in satisfactory                            condition to undergo the procedure.  After obtaining informed consent, the colonoscope                            was passed under direct vision. Throughout the                            procedure, the patient's blood pressure, pulse, and                            oxygen saturations were monitored continuously. The                            CF [id number] #[id number] was introduced through the anus                            and advanced to  the the cecum, identified by                            appendiceal orifice and ileocecal valve. The                            colonoscopy was performed without difficulty. The                            patient tolerated the procedure well. The quality                            of the bowel preparation was adequate to identify                            polyps greater than 5 mm in size. The ileocecal                            valve, appendiceal orifice, and rectum were                            photographed. Scope In: 1:49:08 PM Scope Out: 2:01:16 PM Scope Withdrawal Time: 0 hours 7 minutes 30 seconds  Total Procedure Duration: 0 hours 12 minutes 8 seconds  Findings:                 A few medium-mouthed diverticula were found in the                            sigmoid colon.                           Non-bleeding internal hemorrhoids were found during                            retroflexion. The hemorrhoids were small and Grade                            I (internal hemorrhoids that do not prolapse).  The exam was otherwise without abnormality on                            direct and retroflexion views. Complications:            No immediate complications. Estimated Blood Loss:     Estimated blood loss: none. Impression:               - Mild sigmoid diverticulosis.                           - Non-bleeding internal hemorrhoids.                           - The examination was otherwise normal on direct                            and retroflexion views.                           - No specimens collected. Recommendation:           - Patient has a contact number available for                            emergencies. The signs and symptoms of potential                            delayed complications were discussed with the                            patient. Return to normal activities tomorrow.                            Written discharge instructions were  provided to the                            patient.                           - Resume previous diet.                           - Continue present medications.                           - Repeat colonoscopy in 5 years for screening                            purposes d/t FH CRC.                           - Return to GI clinic PRN. Lynann Bologna, MD 06/22/2022 2:05:13 PM This report has been signed electronically.

## 2022-06-22 NOTE — Patient Instructions (Signed)
YOU HAD AN ENDOSCOPIC PROCEDURE TODAY AT THE [facility]:   Refer to the procedure report that was given to you for any specific questions about what was found during the examination.  If the procedure report does not answer your questions, please call your gastroenterologist to clarify.  If you requested that your care partner not be given the details of your procedure findings, then the procedure report has been included in a sealed envelope for you to review at your convenience later.  YOU SHOULD EXPECT: Some feelings of bloating in the abdomen. Passage of more gas than usual.  Walking can help get rid of the air that was put into your GI tract during the procedure and reduce the bloating. If you had a lower endoscopy (such as a colonoscopy or flexible sigmoidoscopy) you may notice spotting of blood in your stool or on the toilet paper. If you underwent a bowel prep for your procedure, you may not have a normal bowel movement for a few days.  Please Note:  You might notice some irritation and congestion in your nose or some drainage.  This is from the oxygen used during your procedure.  There is no need for concern and it should clear up in a day or so.  SYMPTOMS TO REPORT IMMEDIATELY:  Following lower endoscopy (colonoscopy or flexible sigmoidoscopy):  Excessive amounts of blood in the stool  Significant tenderness or worsening of abdominal pains  Swelling of the abdomen that is new, acute  Fever of 100F or higher  Following upper endoscopy (EGD)  Vomiting of blood or coffee ground material  New chest pain or pain under the shoulder blades  Painful or persistently difficult swallowing  New shortness of breath  Fever of 100F or higher  Black, tarry-looking stools  For urgent or emergent issues, a gastroenterologist can be reached at any hour by calling [phone]. Do not use MyChart messaging for urgent concerns.    DIET:  We do recommend a small meal at first, but  then you may proceed to your regular diet.  Drink plenty of fluids but you should avoid alcoholic beverages for 24 hours.  ACTIVITY:  You should plan to take it easy for the rest of today and you should NOT DRIVE or use heavy machinery until tomorrow (because of the sedation medicines used during the test).    FOLLOW UP: Our staff will call the number listed on your records the next business day following your procedure.  We will call around 7:15- 8:00 am to check on you and address any questions or concerns that you may have regarding the information given to you following your procedure. If we do not reach you, we will leave a message.     If any biopsies were taken you will be contacted by phone or by letter within the next 1-3 weeks.  Please call us at [phone] if you have not heard about the biopsies in 3 weeks.    SIGNATURES/CONFIDENTIALITY: You and/or your care partner have signed paperwork which will be entered into your electronic medical record.  These signatures attest to the fact that that the information above on your After Visit Summary has been reviewed and is understood.  Full responsibility of the confidentiality of this discharge information lies with you and/or your care-partner. 

## 2022-06-22 NOTE — Op Note (Addendum)
[facility] Patient Name: Carolyn Arnold Procedure Date: 06/22/2022 1:30 PM MRN: [id number] Endoscopist: Lynann Bologna , MD, [id number] Age: 67 Referring MD:  Date of Birth: March 17, 1955 Gender: Female Account #: [id number] Procedure:                Upper GI endoscopy Indications:              Dysphagia Medicines:                Monitored Anesthesia Care Procedure:                Pre-Anesthesia Assessment:                           - Prior to the procedure, a History and Physical                            was performed, and patient medications and                            allergies were reviewed. The patient's tolerance of                            previous anesthesia was also reviewed. The risks                            and benefits of the procedure and the sedation                            options and risks were discussed with the patient.                            All questions were answered, and informed consent                            was obtained. Prior Anticoagulants: The patient has                            taken no anticoagulant or antiplatelet agents. ASA                            Grade Assessment: III - A patient with severe                            systemic disease. After reviewing the risks and                            benefits, the patient was deemed in satisfactory                            condition to undergo the procedure.                           After obtaining informed consent, the endoscope was  passed under direct vision. Throughout the                            procedure, the patient's blood pressure, pulse, and                            oxygen saturations were monitored continuously. The                            GIF [id number] #[id number] was introduced through the                            mouth, and advanced to the second part of duodenum.                            The upper GI endoscopy was accomplished  without                            difficulty. The patient tolerated the procedure                            well. Scope In: Scope Out: Findings:                 The mid esophagus and distal esophagus were mildly                            tortuous with well-defined Z-line at 36 cm,                            examined by NBI. Biopsies were obtained from the                            proximal and distal esophagus with cold forceps for                            histology of suspected eosinophilic esophagitis.                            The scope was withdrawn. Dilation was performed                            with a Maloney dilator with mild resistance at 52                            Fr. she had small proximal esophageal inlet patch.                           The entire examined stomach was normal. Biopsies                            were taken with a cold forceps for histology.  The examined duodenum was normal. Biopsies for                            histology were taken with a cold forceps for                            evaluation of celiac disease. Complications:            No immediate complications. Estimated Blood Loss:     Estimated blood loss: none. Impression:               - Mild presbyesophagus.                           - Otherwise normal EGD.                           - S/P empiric esophageal dilatation. Recommendation:           - Patient has a contact number available for                            emergencies. The signs and symptoms of potential                            delayed complications were discussed with the                            patient. Return to normal activities tomorrow.                            Written discharge instructions were provided to the                            patient.                           - Post dil diet.                           - Continue present medications.                           - Await  pathology results.                           - Chew food especially meats and breads well and                            eat slowly.                           - The findings and recommendations were discussed                            with the patient's family. Lynann Bologna, MD 06/22/2022 1:47:17 PM This report has been signed electronically.

## 2022-06-23 ENCOUNTER — Telehealth: Payer: Self-pay

## 2022-06-23 NOTE — Telephone Encounter (Signed)
  Follow up Call-     06/22/2022   12:44 PM  Call back number  Post procedure Call Back phone  # [phone]  Permission to leave phone message Yes     Patient questions:  Do you have a fever, pain , or abdominal swelling? No. Pain Score  0 *  Have you tolerated food without any problems? Yes.    Have you been able to return to your normal activities? Yes.    Do you have any questions about your discharge instructions: Diet   No. Medications  No. Follow up visit  No.  Do you have questions or concerns about your Care? No.  Actions: * If pain score is 4 or above: No action needed, pain <4.

## 2022-06-29 ENCOUNTER — Encounter: Payer: Self-pay | Admitting: Gastroenterology

## 2022-07-11 DIAGNOSIS — G8929 Other chronic pain: Secondary | ICD-10-CM | POA: Diagnosis not present

## 2022-07-11 DIAGNOSIS — I10 Essential (primary) hypertension: Secondary | ICD-10-CM | POA: Diagnosis not present

## 2022-07-11 DIAGNOSIS — E7800 Pure hypercholesterolemia, unspecified: Secondary | ICD-10-CM | POA: Diagnosis not present

## 2022-07-11 DIAGNOSIS — E1169 Type 2 diabetes mellitus with other specified complication: Secondary | ICD-10-CM | POA: Diagnosis not present

## 2022-07-11 DIAGNOSIS — K760 Fatty (change of) liver, not elsewhere classified: Secondary | ICD-10-CM | POA: Diagnosis not present

## 2022-07-11 DIAGNOSIS — K219 Gastro-esophageal reflux disease without esophagitis: Secondary | ICD-10-CM | POA: Diagnosis not present

## 2022-07-25 DIAGNOSIS — H52223 Regular astigmatism, bilateral: Secondary | ICD-10-CM | POA: Diagnosis not present

## 2022-07-25 DIAGNOSIS — H353131 Nonexudative age-related macular degeneration, bilateral, early dry stage: Secondary | ICD-10-CM | POA: Diagnosis not present

## 2022-07-25 DIAGNOSIS — H3561 Retinal hemorrhage, right eye: Secondary | ICD-10-CM | POA: Diagnosis not present

## 2022-07-25 DIAGNOSIS — H2513 Age-related nuclear cataract, bilateral: Secondary | ICD-10-CM | POA: Diagnosis not present

## 2022-07-25 DIAGNOSIS — E119 Type 2 diabetes mellitus without complications: Secondary | ICD-10-CM | POA: Diagnosis not present

## 2022-07-25 DIAGNOSIS — H353130 Nonexudative age-related macular degeneration, bilateral, stage unspecified: Secondary | ICD-10-CM | POA: Diagnosis not present

## 2022-08-15 ENCOUNTER — Ambulatory Visit: Payer: Medicare HMO | Admitting: Gastroenterology

## 2022-08-15 ENCOUNTER — Other Ambulatory Visit (INDEPENDENT_AMBULATORY_CARE_PROVIDER_SITE_OTHER): Payer: Medicare HMO

## 2022-08-15 ENCOUNTER — Encounter: Payer: Self-pay | Admitting: Gastroenterology

## 2022-08-15 VITALS — BP 120/66 | HR 72 | Ht 64.0 in | Wt 206.0 lb

## 2022-08-15 DIAGNOSIS — K219 Gastro-esophageal reflux disease without esophagitis: Secondary | ICD-10-CM | POA: Diagnosis not present

## 2022-08-15 DIAGNOSIS — K581 Irritable bowel syndrome with constipation: Secondary | ICD-10-CM | POA: Diagnosis not present

## 2022-08-15 DIAGNOSIS — K760 Fatty (change of) liver, not elsewhere classified: Secondary | ICD-10-CM

## 2022-08-15 DIAGNOSIS — R7989 Other specified abnormal findings of blood chemistry: Secondary | ICD-10-CM | POA: Diagnosis not present

## 2022-08-15 DIAGNOSIS — Z86010 Personal history of colonic polyps: Secondary | ICD-10-CM | POA: Diagnosis not present

## 2022-08-15 DIAGNOSIS — R1319 Other dysphagia: Secondary | ICD-10-CM

## 2022-08-15 DIAGNOSIS — Z800 Family history of malignant neoplasm of digestive organs: Secondary | ICD-10-CM | POA: Diagnosis not present

## 2022-08-15 LAB — CBC WITH DIFFERENTIAL/PLATELET
Basophils Absolute: 0.1 10*3/uL (ref 0.0–0.1)
Basophils Relative: 0.7 % (ref 0.0–3.0)
Eosinophils Absolute: 0.2 10*3/uL (ref 0.0–0.7)
Eosinophils Relative: 3.1 % (ref 0.0–5.0)
HCT: 39.8 % (ref 36.0–46.0)
Hemoglobin: 13.3 g/dL (ref 12.0–15.0)
Lymphocytes Relative: 27.3 % (ref 12.0–46.0)
Lymphs Abs: 2 10*3/uL (ref 0.7–4.0)
MCHC: 33.4 g/dL (ref 30.0–36.0)
MCV: 91 fl (ref 78.0–100.0)
Monocytes Absolute: 0.5 10*3/uL (ref 0.1–1.0)
Monocytes Relative: 6.8 % (ref 3.0–12.0)
Neutro Abs: 4.6 10*3/uL (ref 1.4–7.7)
Neutrophils Relative %: 62.1 % (ref 43.0–77.0)
Platelets: 176 10*3/uL (ref 150.0–400.0)
RBC: 4.38 Mil/uL (ref 3.87–5.11)
RDW: 13 % (ref 11.5–15.5)
WBC: 7.5 10*3/uL (ref 4.0–10.5)

## 2022-08-15 LAB — COMPREHENSIVE METABOLIC PANEL
ALT: 49 U/L — ABNORMAL HIGH (ref 0–35)
AST: 52 U/L — ABNORMAL HIGH (ref 0–37)
Albumin: 4.3 g/dL (ref 3.5–5.2)
Alkaline Phosphatase: 105 U/L (ref 39–117)
BUN: 14 mg/dL (ref 6–23)
CO2: 26 mEq/L (ref 19–32)
Calcium: 9.7 mg/dL (ref 8.4–10.5)
Chloride: 102 mEq/L (ref 96–112)
Creatinine, Ser: 0.93 mg/dL (ref 0.40–1.20)
GFR: 62.78 mL/min (ref >60.00)
Glucose, Bld: 134 mg/dL — ABNORMAL HIGH (ref 70–99)
Potassium: 4 mEq/L (ref 3.5–5.1)
Sodium: 138 mEq/L (ref 135–145)
Total Bilirubin: 0.6 mg/dL (ref 0.2–1.2)
Total Protein: 7.5 g/dL (ref 6.0–8.3)

## 2022-08-15 LAB — IBC + FERRITIN
Ferritin: 124.8 ng/mL (ref 10.0–291.0)
Iron: 70 ug/dL (ref 42–145)
Saturation Ratios: 22.6 % (ref 20.0–50.0)
TIBC: 309.4 ug/dL (ref 250.0–450.0)
Transferrin: 221 mg/dL (ref 212.0–360.0)

## 2022-08-15 LAB — GAMMA GT: GGT: 25 U/L (ref 7–51)

## 2022-08-15 LAB — PROTIME-INR
INR: 1.1 ratio — ABNORMAL HIGH (ref 0.8–1.0)
Prothrombin Time: 11.4 s (ref 9.6–13.1)

## 2022-08-15 MED ORDER — ESOMEPRAZOLE MAGNESIUM 40 MG PO CPDR: 40.0000 mg | DELAYED_RELEASE_CAPSULE | Freq: Every day | ORAL | 4 refills | Status: DC

## 2022-08-15 NOTE — Patient Instructions (Addendum)
_______________________________________________________  If your blood pressure at your visit was 140/90 or greater, please contact your primary care physician to follow up on this.  _______________________________________________________  If you are age 69 or older, your body mass index should be between 23-30. Your Body mass index is 35.36 kg/m. If this is out of the aforementioned range listed, please consider follow up with your Primary Care Provider.  If you are age 27 or younger, your body mass index should be between 19-25. Your Body mass index is 35.36 kg/m. If this is out of the aformentioned range listed, please consider follow up with your Primary Care Provider.   ________________________________________________________  The [facility] providers would like to encourage you to use [facility] to communicate with providers for non-urgent requests or questions.  Due to long hold times on the telephone, sending your provider a message by [facility] may be a faster and more efficient way to get a response.  Please allow 48 business hours for a response.  Please remember that this is for non-urgent requests.  _______________________________________________________  Your provider has requested that you go to the basement level for lab work before leaving today. Press "B" on the elevator. The lab is located at the first door on the left as you exit the elevator.  We have sent the following medications to your pharmacy for you to pick up at your convenience: Nexium  Please follow up in 6 months. Give [place] a call at [phone] to schedule an appointment.  Thank you,  Dr. Lynann Bologna

## 2022-08-15 NOTE — Progress Notes (Signed)
Chief Complaint: For FU  Referring Provider:  Alinda Deem, MD      ASSESSMENT AND PLAN;   #1. Fatty liver (MASLD) with abn LFTs.  At risk for liver cirrhosis.  USE kPa 28.2 (04/2022). With assoc DM2, obesity, HLD.  Neg acute hepatitis panel 08/2013.  No ETOH. Nl alb/plts.  #2. GERD with eso dysphagia - resolved after EGD with dil 06/2022  #3. H/O polyps/FH CRC (mom in mid 60s). Next colon due 06/2027  #4. IBS-C    Plan:  -Continue nexium 40mg  po QD #90, 4RF -Check CBC, CMP, acute viral hepatitis, FIB-4, autoimmune hepatitis panel (AMA, ASMA) SPEP, iron studies, serum ceruloplasmin, A1AT, celiac screen and GGT. -Check alpha-fetoprotein and PT INR. -Check anti-HAV total Ab and HBsAb.  If negative, would recommend vaccination for hepatitis A and B. -Discussed wt loss. -Continue probiotics. -FU in 6 months.  Annual US liver alternating with USE every other year.    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  Constipation better with probiotics and high-fiber diet.  Lost 20lb when she had COVID.  Has been able to lose more as below  Wt Readings from Last 3 Encounters:  08/15/22 206 lb (93.4 kg)  06/22/22 212 lb (96.2 kg)  04/26/22 212 lb (96.2 kg)   No H/O itching, skin lesions, easy bruisability, intake of OTC meds including diet pills, herbal medications, anabolic steroids or Tylenol. There is no H/O blood transfusions, IVDA or FH of liver disease. No jaundice, dark urine or pale stools. No alcohol abuse.   Past GI workup: USE 05/04/2022 Echogenic hepatic parenchyma without gross mass or nodularity, question fatty infiltration versus cirrhosis. Incomplete pancreatic visualization. Post cholecystectomy. ULTRASOUND HEPATIC  ELASTOGRAPHY: Median kPa:  28.2 Diagnostic category: > or =17 kPa: highly suggestive of cACLD with an increased probability of clinically significant portal hypertension    Colonoscopy 06/22/2022  - Mild sigmoid diverticulosis. - Non-bleeding internal hemorrhoids. - The examination was otherwise normal on direct and retroflexion views. - No specimens collected. - Rpt 5 yrs d/t FH CRC  EGD 06/22/2022 - Mild presbyesophagus.  - Otherwise normal EGD.  - S/P empiric esophageal dilatation 54 Fr. - Neg eso, gastric, SB bx  Colonoscopy 01/31/2013 ([facility]) -Colonic polyp s/p polypectomy. Bx-tubular adenoma -Mild sigmoid diverticulosis -Internal hemorrhoids -Repeat in 5 years.  She did get letters.  Unfortunately, her husband was having significant medical issues-hence she did not schedule  EGD by Dr. Jennye Boroughs 2008  [place] 11/2013 showing fatty liver.    SH- husband-CABG Past Medical History:  Diagnosis Date   ALLERGIC RHINITIS 07/20/2009   Qualifier: Diagnosis of  By: Delton Coombes MD, Les Pou    Asthma    INTERMIETTENT ASTHMA USES INHALER PRN   Calcium deposit in bursa of elbow, left    AND RIGHT "PSEUDO GOUT"   Carpal tunnel syndrome    BILATERAL   Chronic lower back pain    Diet-controlled type 2 diabetes mellitus (HCC)    Dysrhythmia    HEART PAPITATIONS   Family history of adverse reaction to anesthesia    SISTER TROUBLE WAKING UP   FATTY LIVER DISEASE, HX OF 10/29/2008   Qualifier: Diagnosis of  By: Denyse Amass, CMA, Carol     Fibromyalgia    GERD (gastroesophageal  reflux disease)    Gout    Hyperlipidemia    Hypertension    IBS (irritable bowel syndrome)    Intermittent constipation    Mitral valve prolapse    MVP (mitral valve prolapse)    Osteopenia    Pancreatitis 20 YRS AGO   Pericardial effusion    Chronic   Renal stones    SCARRING INSIDE BOTH KIDNEYS PER PATIENT    Past Surgical History:  Procedure Laterality Date   CHOLECYSTECTOMY  1993   LAPAROSCOPIC IN  [place]   ELBOW FRACTURE SURGERY Right 1973   KNEE ARTHROSCOPY Right 2017   LUMBAR LAMINECTOMY  1998 & 2008   2 OR 3 RAY CARGES LOWER BACK WITH TITANOIM PLATES AND SCREWS PER PT   PARTIAL KNEE ARTHROPLASTY Right 08/27/2017   Procedure: UNICOMPARTMENTAL RIGHT KNEE;  Surgeon: Dannielle Huh, MD;  Location: [facility];  Service: Orthopedics;  Laterality: Right;  Adductor Block   right shoulder clavical reduction for arthritis  2016   SHOULDER SURGERY Left    by Dr. Thedore Mins for bone spurs   TONSILLECTOMY AND ADENOIDECTOMY  AS CHILD   TOTAL ABDOMINAL HYSTERECTOMY W/ BILATERAL SALPINGOOPHORECTOMY  1988   for endometriosis    Family History  Problem Relation Age of Onset   Coronary artery disease Mother    Breast cancer Mother    Colon cancer Mother    Lung cancer Mother    COPD Mother    COPD Father    Hypertension Sister    Hyperlipidemia Sister    Breast cancer Sister    Diabetes Sister    Breast cancer Daughter    Breast cancer Maternal Aunt    Coronary artery disease Brother    Hyperlipidemia Brother     Social History   Tobacco Use   Smoking status: Never   Smokeless tobacco: Never  Vaping Use   Vaping status: Never Used  Substance Use Topics   Alcohol use: Never   Drug use: Never    Current Outpatient Medications  Medication Sig Dispense Refill   albuterol (PROVENTIL HFA;VENTOLIN HFA) 108 (90 Base) MCG/ACT inhaler Inhale 1-2 puffs into the lungs every 6 (six) hours as needed for wheezing or shortness of breath.     allopurinol (ZYLOPRIM) 300 MG tablet Take 300 mg by mouth at bedtime.      aspirin EC 81 MG tablet Take 81 mg by mouth daily. Swallow whole.     atenolol (TENORMIN) 50 MG tablet Take 50 mg by mouth at bedtime.      cyclobenzaprine (FLEXERIL) 10 MG tablet Take 10 mg by mouth 3 (three) times daily as needed ((typically scheduled twice daily)). For back pain     diazepam (VALIUM) 5 MG tablet Take 5 mg by mouth 2 (two) times daily as needed for anxiety.       esomeprazole (NEXIUM) 40 MG capsule Take 1 capsule (40 mg total) by mouth daily. 90 capsule 4   fluticasone (FLONASE) 50 MCG/ACT nasal spray Place 2 sprays into both nostrils daily.     gabapentin (NEURONTIN) 300 MG capsule Take 600 mg by mouth at bedtime.     gentamicin ointment (GARAMYCIN) 0.1 % Apply 1 application topically 3 (three) times daily as needed (for skin rash).      hydrOXYzine (ATARAX/VISTARIL) 10 MG tablet Take 10 mg by mouth every 6 (six) hours as needed for itching.     hyoscyamine (LEVSIN SL) 0.125 MG SL tablet Place 0.125 mg under the tongue 3 (three) times daily  as needed (for abdominal pain (IBS)).     meclizine (ANTIVERT) 25 MG tablet Take 25 mg by mouth every 6 (six) hours as needed for dizziness.      Oxycodone HCl 10 MG TABS Take 1-2 tablets (10-20 mg total) by mouth every 6 (six) hours as needed. 40 tablet 0   polyethylene glycol (MIRALAX / GLYCOLAX) packet Take 17 g by mouth daily as needed (for constipation.).      pravastatin (PRAVACHOL) 40 MG tablet Take 40 mg by mouth at bedtime.     triamcinolone cream (KENALOG) 0.1 % Apply 1 application topically 2 (two) times daily as needed (For itching and rash).      No current facility-administered medications for this visit.    Allergies  Allergen Reactions   Ambien [Zolpidem Tartrate] Other (See Comments)    Irregular rhythm   Sulfonamide Derivatives Hives and Rash   Welchol [Colesevelam Hcl] Other (See Comments)    Liver function abnormalities   Zolpidem Tartrate    Celecoxib Hives and Rash   Cox-2 Inhibitors Rash and Hypertension   Cymbalta [Duloxetine Hcl] Nausea And Vomiting   Mobic [Meloxicam] Other (See Comments)    Heartburn   Rofecoxib Hives and Rash    (Vioxx)   Trazodone And Nefazodone Palpitations   Zyprexa [Olanzapine] Palpitations    Review of Systems:  neg     Physical Exam:    BP (!) 160/100 (BP Location: Left Arm, Patient Position: Sitting, Cuff Size: Normal)   Pulse 72   Ht 5\' 4"   (1.626 m)   Wt 206 lb (93.4 kg)   BMI 35.36 kg/m  Wt Readings from Last 3 Encounters:  08/15/22 206 lb (93.4 kg)  06/22/22 212 lb (96.2 kg)  04/26/22 212 lb (96.2 kg)   Constitutional:  Well-developed, in no acute distress. Psychiatric: Normal mood and affect. Behavior is normal. HEENT: Pupils normal.  Conjunctivae are normal. No scleral icterus. Cardiovascular: Normal rate, regular rhythm. No edema Pulmonary/chest: Effort normal and breath sounds normal. No wheezing, rales or rhonchi. Abdominal: Soft, nondistended. Nontender. Bowel sounds active throughout. There are no masses palpable. No hepatomegaly. Rectal: Deferred Neurological: Alert and oriented to person place and time. Skin: Skin is warm and dry. No rashes noted.  Data Reviewed: I have personally reviewed following labs and imaging studies  CBC:    Latest Ref Rng & Units 06/13/2022   11:22 AM 08/20/2017   12:45 PM  CBC  WBC 4.0 - 10.5 K/uL 6.3  6.7   Hemoglobin 12.0 - 15.0 g/dL 16.1  09.6   Hematocrit 36.0 - 46.0 % 40.5  40.3   Platelets 150.0 - 400.0 K/uL 185.0  186     CMP:    Latest Ref Rng & Units 06/13/2022   11:22 AM 08/20/2017   12:45 PM 06/06/2007    3:32 PM  CMP  Glucose 70 - 99 mg/dL 045  409  811   BUN 6 - 23 mg/dL 14  15  11    Creatinine 0.40 - 1.20 mg/dL 9.14  7.82  0.9   Sodium 135 - 145 mEq/L 138  142  140   Potassium 3.5 - 5.1 mEq/L 3.9  4.9  3.2   Chloride 96 - 112 mEq/L 102  105  103   CO2 19 - 32 mEq/L 29  30  29    Calcium 8.4 - 10.5 mg/dL 9.7  9.6  9.3   Total Protein 6.0 - 8.3 g/dL 7.4  7.6  7.7   Total Bilirubin  0.2 - 1.2 mg/dL 0.8  0.7  1.1   Alkaline Phos 39 - 117 U/L 98  124  122   AST 0 - 37 U/L 62  68  182   ALT 0 - 35 U/L 50  48  171     D/W family   Edman Circle, MD 08/15/2022, 1:41 PM  Cc: Alinda Deem, MD

## 2022-08-23 LAB — FIB-4 W/RX NASH FIBROSURE PLUS: Platelets: 171 10*3/uL (ref 150–450)

## 2022-08-27 NOTE — Progress Notes (Signed)
Please inform the patient. FIB-4 3.26 (significantly elevated), so was USE kPa 28.2  Suggest: (If she is agreeable) -Transjugular liver biopsy with IR with HVPG (hepatic venous pressure gradient) measurements. -Would need PT/INR prior  Send report to family physician

## 2022-08-29 ENCOUNTER — Other Ambulatory Visit: Payer: Self-pay

## 2022-08-29 ENCOUNTER — Other Ambulatory Visit (HOSPITAL_COMMUNITY): Payer: Self-pay | Admitting: Diagnostic Radiology

## 2022-08-29 DIAGNOSIS — K760 Fatty (change of) liver, not elsewhere classified: Secondary | ICD-10-CM

## 2022-08-29 DIAGNOSIS — R7989 Other specified abnormal findings of blood chemistry: Secondary | ICD-10-CM

## 2022-09-07 ENCOUNTER — Telehealth: Payer: Self-pay | Admitting: Gastroenterology

## 2022-09-07 NOTE — Telephone Encounter (Signed)
Patent called to reschedule liver bx has tested positive for Covid-19.

## 2022-09-08 NOTE — Telephone Encounter (Signed)
Left message for patient to call back  

## 2022-09-12 ENCOUNTER — Ambulatory Visit (HOSPITAL_COMMUNITY): Payer: Medicare HMO

## 2022-09-12 ENCOUNTER — Other Ambulatory Visit (HOSPITAL_COMMUNITY): Payer: Medicare HMO

## 2022-09-12 NOTE — Telephone Encounter (Signed)
Pt made aware that she has been rescheduled for her Liver BX on 09/28/2022 at 11:30 AM at [facility]. Nothing to eat of drink starting at 7 AM. Pt made aware. Pt aware to come to our lab 1 or 2 days before to have PT / INR done.  Pt verbalized understanding with all questions answered.

## 2022-09-27 ENCOUNTER — Other Ambulatory Visit: Payer: Self-pay | Admitting: Radiology

## 2022-09-27 DIAGNOSIS — R7989 Other specified abnormal findings of blood chemistry: Secondary | ICD-10-CM

## 2022-09-27 NOTE — Telephone Encounter (Signed)
Patient called stating that she had to reschedule her liver biopsy due to husbands health issues. Patient stated that they are planning on  reschedule her for 10/18 and does not have a time yet.

## 2022-09-27 NOTE — Telephone Encounter (Signed)
Pt stated that she had to reschedule her Liver Biopsy due to her Husband who has to have Surgery for Liver Cancer on the same date that is was originally scheduled.

## 2022-09-28 ENCOUNTER — Other Ambulatory Visit (HOSPITAL_COMMUNITY): Payer: Medicare HMO

## 2022-09-28 ENCOUNTER — Ambulatory Visit (HOSPITAL_COMMUNITY): Payer: Medicare HMO

## 2022-10-11 DIAGNOSIS — M7061 Trochanteric bursitis, right hip: Secondary | ICD-10-CM | POA: Diagnosis not present

## 2022-10-25 ENCOUNTER — Other Ambulatory Visit (HOSPITAL_COMMUNITY): Payer: Self-pay | Admitting: Interventional Radiology

## 2022-10-25 DIAGNOSIS — R7989 Other specified abnormal findings of blood chemistry: Secondary | ICD-10-CM

## 2022-10-25 DIAGNOSIS — K760 Fatty (change of) liver, not elsewhere classified: Secondary | ICD-10-CM

## 2022-10-26 ENCOUNTER — Other Ambulatory Visit: Payer: Self-pay | Admitting: Radiology

## 2022-10-26 ENCOUNTER — Other Ambulatory Visit: Payer: Self-pay | Admitting: Student

## 2022-10-26 NOTE — Consult Note (Signed)
Chief Complaint: Patient was seen in consultation today for image guided transjugular liver biopsy with hepatic venous pressure measurements  Referring Physician(s): Gupta,Rajesh  Supervising Physician: Marliss Coots  Patient Status: [facility]  History of Present Illness: Carolyn Arnold is a 67 y.o. female with past medical history significant for asthma, diabetes, fibromyalgia, GERD, gout, hypertension, hyperlipidemia, IBS, mitral valve prolapse, pancreatitis, pericardial effusion, nephrolithiasis, fatty liver by imaging, obesity, prior cholecystectomy who presents now with persistently elevated LFTs, significantly elevated FIB-4 index (fibrosis score).  She is scheduled today for image guided transjugular liver biopsy with hepatic venous pressure measurements for further evaluation.  Past Medical History:  Diagnosis Date   ALLERGIC RHINITIS 07/20/2009   Qualifier: Diagnosis of  By: Delton Coombes MD, Les Pou    Asthma    INTERMIETTENT ASTHMA USES INHALER PRN   Calcium deposit in bursa of elbow, left    AND RIGHT "PSEUDO GOUT"   Carpal tunnel syndrome    BILATERAL   Chronic lower back pain    Diet-controlled type 2 diabetes mellitus (HCC)    Dysrhythmia    HEART PAPITATIONS   Family history of adverse reaction to anesthesia    SISTER TROUBLE WAKING UP   FATTY LIVER DISEASE, HX OF 10/29/2008   Qualifier: Diagnosis of  By: Denyse Amass, CMA, Carol     Fibromyalgia    GERD (gastroesophageal reflux disease)    Gout    Hyperlipidemia    Hypertension    IBS (irritable bowel syndrome)    Intermittent constipation    Mitral valve prolapse    MVP (mitral valve prolapse)    Osteopenia    Pancreatitis 20 YRS AGO   Pericardial effusion    Chronic   Renal stones    SCARRING INSIDE BOTH KIDNEYS PER PATIENT    Past Surgical History:  Procedure Laterality Date   CHOLECYSTECTOMY  1993   LAPAROSCOPIC IN [place]   ELBOW FRACTURE SURGERY Right 1973   KNEE ARTHROSCOPY Right 2017    LUMBAR LAMINECTOMY  1998 & 2008   2 OR 3 RAY CARGES LOWER BACK WITH TITANOIM PLATES AND SCREWS PER PT   PARTIAL KNEE ARTHROPLASTY Right 08/27/2017   Procedure: UNICOMPARTMENTAL RIGHT KNEE;  Surgeon: Dannielle Huh, MD;  Location: [facility];  Service: Orthopedics;  Laterality: Right;  Adductor Block   right shoulder clavical reduction for arthritis  2016   SHOULDER SURGERY Left    by Dr. Thedore Mins for bone spurs   TONSILLECTOMY AND ADENOIDECTOMY  AS CHILD   TOTAL ABDOMINAL HYSTERECTOMY W/ BILATERAL SALPINGOOPHORECTOMY  1988   for endometriosis    Allergies: Ambien [zolpidem tartrate], Sulfonamide derivatives, Welchol [colesevelam hcl], Zolpidem tartrate, Celecoxib, Cox-2 inhibitors (sulfonamide), Cymbalta [duloxetine hcl], Mobic [meloxicam], Rofecoxib, Trazodone and nefazodone, and Zyprexa [olanzapine]  Medications: Prior to Admission medications   Medication Sig Start Date End Date Taking? Authorizing Provider  albuterol (PROVENTIL HFA;VENTOLIN HFA) 108 (90 Base) MCG/ACT inhaler Inhale 1-2 puffs into the lungs every 6 (six) hours as needed for wheezing or shortness of breath.    [provider]  allopurinol (ZYLOPRIM) 300 MG tablet Take 300 mg by mouth at bedtime.     [provider]  aspirin EC 81 MG tablet Take 81 mg by mouth daily. Swallow whole.    [provider]  atenolol (TENORMIN) 50 MG tablet Take 50 mg by mouth at bedtime.     [provider]  cyclobenzaprine (FLEXERIL) 10 MG tablet Take 10 mg by mouth 3 (three) times daily as needed ((  typically scheduled twice daily)). For back pain    [provider]  diazepam (VALIUM) 5 MG tablet Take 5 mg by mouth 2 (two) times daily as needed for anxiety.     [provider]  esomeprazole (NEXIUM) 40 MG capsule Take 1 capsule (40 mg total) by mouth daily. 04/26/22   Lynann Bologna, MD  esomeprazole (NEXIUM) 40 MG capsule Take 1 capsule (40 mg total) by mouth daily. 08/15/22   Lynann Bologna, MD   fluticasone (FLONASE) 50 MCG/ACT nasal spray Place 2 sprays into both nostrils daily.    [provider]  gabapentin (NEURONTIN) 300 MG capsule Take 600 mg by mouth at bedtime.    [provider]  gentamicin ointment (GARAMYCIN) 0.1 % Apply 1 application topically 3 (three) times daily as needed (for skin rash).     [provider]  hydrOXYzine (ATARAX/VISTARIL) 10 MG tablet Take 10 mg by mouth every 6 (six) hours as needed for itching.    [provider]  hyoscyamine (LEVSIN SL) 0.125 MG SL tablet Place 0.125 mg under the tongue 3 (three) times daily as needed (for abdominal pain (IBS)).    [provider]  meclizine (ANTIVERT) 25 MG tablet Take 25 mg by mouth every 6 (six) hours as needed for dizziness.     [provider]  Oxycodone HCl 10 MG TABS Take 1-2 tablets (10-20 mg total) by mouth every 6 (six) hours as needed. 08/27/17   Guy Sandifer, PA  polyethylene glycol [facility]) packet Take 17 g by mouth daily as needed (for constipation.).     [provider]  pravastatin (PRAVACHOL) 40 MG tablet Take 40 mg by mouth at bedtime.    [provider]  triamcinolone cream (KENALOG) 0.1 % Apply 1 application topically 2 (two) times daily as needed (For itching and rash).     [provider]     Family History  Problem Relation Age of Onset   Coronary artery disease Mother    Breast cancer Mother    Colon cancer Mother    Lung cancer Mother    COPD Mother    COPD Father    Hypertension Sister    Hyperlipidemia Sister    Breast cancer Sister    Diabetes Sister    Breast cancer Daughter    Breast cancer Maternal Aunt    Coronary artery disease Brother    Hyperlipidemia Brother     Social History   Socioeconomic History   Marital status: Married    Spouse name: Not on file   Number of children: Not on file   Years of education: Not on file   Highest education level: Not on file   Occupational History   Not on file  Tobacco Use   Smoking status: Never   Smokeless tobacco: Never  Vaping Use   Vaping status: Never Used  Substance and Sexual Activity   Alcohol use: Never   Drug use: Never   Sexual activity: Not on file  Other Topics Concern   Not on file  Social History Narrative   Not on file   Social Determinants of Health   Financial Resource Strain: Not on file  Food Insecurity: Not on file  Transportation Needs: Not on file  Physical Activity: Not on file  Stress: Not on file  Social Connections: Not on file      Review of Systems: denies fever,HA,CP,dyspnea, cough, N/V or bleeding; she does have neck/back pain, some mild epigastric  discomfort  Vital Signs: Vitals:   10/27/22 1050  BP: (!) 152/86  Pulse: 74  Resp: 16  Temp: 98.7 F (37.1 C)  SpO2: 100%      Code Status: FULL CODE  Advance Care Plan: No documents on file   Physical Exam: awake/alert; chest- CTA bilat; heart- RRR; abd-obese, soft,+BS, mildly tender epigastric region, no sig LE edema  Imaging: No results found.  Labs:  CBC: Recent Labs    06/13/22 1122 08/15/22 1425  WBC 6.3 7.5  HGB 13.2 13.3  HCT 40.5 39.8  PLT 185.0 176.0  171    COAGS: Recent Labs    06/13/22 1122 08/15/22 1425  INR 1.1* 1.1*    BMP: Recent Labs    06/13/22 1122 08/15/22 1425  NA 138 138  K 3.9 4.0  CL 102 102  CO2 29 26  GLUCOSE 120* 134*  BUN 14 14  CALCIUM 9.7 9.7  CREATININE 1.04 0.93    LIVER FUNCTION TESTS: Recent Labs    06/13/22 1122 08/15/22 1425  BILITOT 0.8 0.6  AST 62* 56*  52*  ALT 50* 48*  49*  ALKPHOS 98 105  PROT 7.4 7.3  7.5  ALBUMIN 4.2 4.3    TUMOR MARKERS: Recent Labs    08/15/22 1425  AFPTM 5.6    Assessment and Plan: 67 y.o. female with past medical history significant for asthma, diabetes, fibromyalgia, GERD, gout, hypertension, hyperlipidemia, IBS, mitral valve prolapse, pancreatitis, pericardial effusion,  nephrolithiasis, fatty liver by imaging, obesity, prior cholecystectomy who presents now with persistently elevated LFTs, significantly elevated FIB-4 index (fibrosis score).  She is scheduled today for image guided transjugular liver biopsy with hepatic venous pressure measurements for further evaluation.Risks and benefits of procedure was discussed with the patient including, but not limited to bleeding, infection, damage to adjacent structures or low yield requiring additional tests.  All of the questions were answered and there is agreement to proceed.  Consent signed and in chart.  LABS PENDING; CT abd pending today  Thank you for this interesting consult.  I greatly enjoyed meeting SHELLEY PULS and look forward to participating in their care.  A copy of this report was sent to the requesting provider on this date.  Electronically Signed: D. Jeananne Rama, PA-C 10/26/2022, 2:36 PM   I spent a total of 25 minutes    in face to face in clinical consultation, greater than 50% of which was counseling/coordinating care for image guided transjugular liver biopsy with hepatic venous pressure measurements

## 2022-10-27 ENCOUNTER — Ambulatory Visit (HOSPITAL_COMMUNITY)
Admission: RE | Admit: 2022-10-27 | Discharge: 2022-10-27 | Disposition: A | Discharge status: Home / Self Care | Payer: Medicare HMO | Source: Ambulatory Visit | Attending: Interventional Radiology | Admitting: Interventional Radiology

## 2022-10-27 ENCOUNTER — Other Ambulatory Visit: Payer: Self-pay | Admitting: Gastroenterology

## 2022-10-27 ENCOUNTER — Ambulatory Visit (HOSPITAL_COMMUNITY)
Admission: RE | Admit: 2022-10-27 | Discharge: 2022-10-27 | Disposition: A | Discharge status: Home / Self Care | Payer: Medicare HMO | Source: Ambulatory Visit | Attending: Diagnostic Radiology | Admitting: Diagnostic Radiology

## 2022-10-27 ENCOUNTER — Encounter (HOSPITAL_COMMUNITY): Payer: Self-pay

## 2022-10-27 ENCOUNTER — Ambulatory Visit (HOSPITAL_COMMUNITY)
Admission: RE | Admit: 2022-10-27 | Discharge: 2022-10-27 | Disposition: A | Discharge status: Home / Self Care | Payer: Medicare HMO | Source: Ambulatory Visit | Attending: Gastroenterology | Admitting: Gastroenterology

## 2022-10-27 DIAGNOSIS — K219 Gastro-esophageal reflux disease without esophagitis: Secondary | ICD-10-CM | POA: Insufficient documentation

## 2022-10-27 DIAGNOSIS — K760 Fatty (change of) liver, not elsewhere classified: Secondary | ICD-10-CM

## 2022-10-27 DIAGNOSIS — J45909 Unspecified asthma, uncomplicated: Secondary | ICD-10-CM | POA: Diagnosis not present

## 2022-10-27 DIAGNOSIS — I341 Nonrheumatic mitral (valve) prolapse: Secondary | ICD-10-CM | POA: Insufficient documentation

## 2022-10-27 DIAGNOSIS — E119 Type 2 diabetes mellitus without complications: Secondary | ICD-10-CM | POA: Insufficient documentation

## 2022-10-27 DIAGNOSIS — R748 Abnormal levels of other serum enzymes: Secondary | ICD-10-CM | POA: Diagnosis not present

## 2022-10-27 DIAGNOSIS — K7581 Nonalcoholic steatohepatitis (NASH): Secondary | ICD-10-CM | POA: Diagnosis not present

## 2022-10-27 DIAGNOSIS — M797 Fibromyalgia: Secondary | ICD-10-CM | POA: Insufficient documentation

## 2022-10-27 DIAGNOSIS — R7989 Other specified abnormal findings of blood chemistry: Secondary | ICD-10-CM

## 2022-10-27 DIAGNOSIS — K8689 Other specified diseases of pancreas: Secondary | ICD-10-CM | POA: Diagnosis not present

## 2022-10-27 DIAGNOSIS — E785 Hyperlipidemia, unspecified: Secondary | ICD-10-CM | POA: Insufficient documentation

## 2022-10-27 DIAGNOSIS — M109 Gout, unspecified: Secondary | ICD-10-CM | POA: Insufficient documentation

## 2022-10-27 DIAGNOSIS — Z9049 Acquired absence of other specified parts of digestive tract: Secondary | ICD-10-CM | POA: Diagnosis not present

## 2022-10-27 DIAGNOSIS — I700 Atherosclerosis of aorta: Secondary | ICD-10-CM | POA: Insufficient documentation

## 2022-10-27 DIAGNOSIS — I10 Essential (primary) hypertension: Secondary | ICD-10-CM | POA: Insufficient documentation

## 2022-10-27 HISTORY — PX: IR TRANSCATHETER BX: IMG713

## 2022-10-27 HISTORY — PX: IR US GUIDE VASC ACCESS RIGHT: IMG2390

## 2022-10-27 HISTORY — PX: IR VENOGRAM HEPATIC W HEMODYNAMIC EVALUATION: IMG692

## 2022-10-27 LAB — CBC WITH DIFFERENTIAL/PLATELET
Abs Immature Granulocytes: 0.02 10*3/uL (ref 0.00–0.07)
Basophils Absolute: 0 10*3/uL (ref 0.0–0.1)
Basophils Relative: 1 %
Eosinophils Absolute: 0.1 10*3/uL (ref 0.0–0.5)
Eosinophils Relative: 2 %
HCT: 43.3 % (ref 36.0–46.0)
Hemoglobin: 14.3 g/dL (ref 12.0–15.0)
Immature Granulocytes: 0 %
Lymphocytes Relative: 28 %
Lymphs Abs: 2.4 10*3/uL (ref 0.7–4.0)
MCH: 30.9 pg (ref 26.0–34.0)
MCHC: 33 g/dL (ref 30.0–36.0)
MCV: 93.5 fL (ref 80.0–100.0)
Monocytes Absolute: 0.6 10*3/uL (ref 0.1–1.0)
Monocytes Relative: 7 %
Neutro Abs: 5.2 10*3/uL (ref 1.7–7.7)
Neutrophils Relative %: 62 %
Platelets: 175 10*3/uL (ref 150–400)
RBC: 4.63 MIL/uL (ref 3.87–5.11)
RDW: 13.6 % (ref 11.5–15.5)
WBC: 8.3 10*3/uL (ref 4.0–10.5)
nRBC: 0 % (ref 0.0–0.2)

## 2022-10-27 LAB — COMPREHENSIVE METABOLIC PANEL
ALT: 31 U/L (ref 0–44)
AST: 36 U/L (ref 15–41)
Albumin: 4.1 g/dL (ref 3.5–5.0)
Alkaline Phosphatase: 97 U/L (ref 38–126)
Anion gap: 10 (ref 5–15)
BUN: 13 mg/dL (ref 8–23)
CO2: 23 mmol/L (ref 22–32)
Calcium: 9.5 mg/dL (ref 8.9–10.3)
Chloride: 103 mmol/L (ref 98–111)
Creatinine, Ser: 0.86 mg/dL (ref 0.44–1.00)
GFR, Estimated: >60 mL/min (ref >60)
Glucose, Bld: 117 mg/dL — ABNORMAL HIGH (ref 70–99)
Potassium: 4.1 mmol/L (ref 3.5–5.1)
Sodium: 136 mmol/L (ref 135–145)
Total Bilirubin: 0.9 mg/dL (ref 0.3–1.2)
Total Protein: 7.5 g/dL (ref 6.5–8.1)

## 2022-10-27 LAB — PROTIME-INR
INR: 1 (ref 0.8–1.2)
Prothrombin Time: 13.2 s (ref 11.4–15.2)

## 2022-10-27 MED ORDER — MIDAZOLAM HCL 2 MG/2ML IJ SOLN
INTRAMUSCULAR | Status: AC
  Filled 2022-10-27: qty 4

## 2022-10-27 MED ORDER — SODIUM CHLORIDE 0.9 % IV SOLN: INTRAVENOUS | Status: DC

## 2022-10-27 MED ORDER — FENTANYL CITRATE (PF) 100 MCG/2ML IJ SOLN
INTRAMUSCULAR | Status: AC
  Filled 2022-10-27: qty 2

## 2022-10-27 MED ORDER — FENTANYL CITRATE (PF) 100 MCG/2ML IJ SOLN
INTRAMUSCULAR | Status: AC | PRN
Start: 2022-10-27 — End: 2022-10-27
  Administered 2022-10-27 (×3): 50 ug via INTRAVENOUS

## 2022-10-27 MED ORDER — IOHEXOL 300 MG/ML  SOLN
50.0000 mL | Freq: Once | INTRAMUSCULAR | Status: AC | PRN
  Administered 2022-10-27: 35 mL

## 2022-10-27 MED ORDER — LIDOCAINE-EPINEPHRINE 1 %-1:100000 IJ SOLN
20.0000 mL | Freq: Once | INTRAMUSCULAR | Status: AC
  Administered 2022-10-27: 4 mL via INTRADERMAL

## 2022-10-27 MED ORDER — IOHEXOL 300 MG/ML  SOLN
50.0000 mL | Freq: Once | INTRAMUSCULAR | Status: AC | PRN
  Administered 2022-10-27: 5 mL

## 2022-10-27 MED ORDER — MIDAZOLAM HCL 2 MG/2ML IJ SOLN
INTRAMUSCULAR | Status: AC | PRN
Start: 2022-10-27 — End: 2022-10-27
  Administered 2022-10-27: 1 mg via INTRAVENOUS

## 2022-10-27 MED ORDER — LIDOCAINE-EPINEPHRINE 1 %-1:100000 IJ SOLN
INTRAMUSCULAR | Status: AC
  Filled 2022-10-27: qty 1

## 2022-10-27 NOTE — Procedures (Signed)
Interventional Radiology Procedure Note  Procedure: Transvenous liver biopsy  Findings: Please refer to procedural dictation for full description. 18 ga core from right lobe x3 via femoral approach.  Mean pressure (mmHg) measurements: IVC/RA = 6 Right hepatic vein = 8 Wedged portal vein = 13 Hepatic-Portal vein gradient = 5 Portosystemic gradient = 7  Complications: None immediate  Estimated Blood Loss: < 5 mL  Recommendations: Strict 3 hour bedrest. Follow Pathology results.   Marliss Coots, MD

## 2022-10-30 LAB — SURGICAL PATHOLOGY

## 2022-11-10 ENCOUNTER — Encounter: Payer: Self-pay | Admitting: Gastroenterology

## 2022-12-18 DIAGNOSIS — Z6835 Body mass index (BMI) 35.0-35.9, adult: Secondary | ICD-10-CM | POA: Diagnosis not present

## 2022-12-18 DIAGNOSIS — K760 Fatty (change of) liver, not elsewhere classified: Secondary | ICD-10-CM | POA: Diagnosis not present

## 2022-12-18 DIAGNOSIS — Z0000 Encounter for general adult medical examination without abnormal findings: Secondary | ICD-10-CM | POA: Diagnosis not present

## 2022-12-18 DIAGNOSIS — E1169 Type 2 diabetes mellitus with other specified complication: Secondary | ICD-10-CM | POA: Diagnosis not present

## 2022-12-18 DIAGNOSIS — M8588 Other specified disorders of bone density and structure, other site: Secondary | ICD-10-CM | POA: Diagnosis not present

## 2022-12-18 DIAGNOSIS — I10 Essential (primary) hypertension: Secondary | ICD-10-CM | POA: Diagnosis not present

## 2022-12-18 DIAGNOSIS — K219 Gastro-esophageal reflux disease without esophagitis: Secondary | ICD-10-CM | POA: Diagnosis not present

## 2022-12-18 DIAGNOSIS — Z1389 Encounter for screening for other disorder: Secondary | ICD-10-CM | POA: Diagnosis not present

## 2022-12-18 DIAGNOSIS — E7800 Pure hypercholesterolemia, unspecified: Secondary | ICD-10-CM | POA: Diagnosis not present

## 2022-12-19 ENCOUNTER — Other Ambulatory Visit: Payer: Self-pay | Admitting: Family Medicine

## 2022-12-19 DIAGNOSIS — I10 Essential (primary) hypertension: Secondary | ICD-10-CM | POA: Diagnosis not present

## 2022-12-19 DIAGNOSIS — E1169 Type 2 diabetes mellitus with other specified complication: Secondary | ICD-10-CM | POA: Diagnosis not present

## 2022-12-19 DIAGNOSIS — Z1231 Encounter for screening mammogram for malignant neoplasm of breast: Secondary | ICD-10-CM

## 2022-12-19 DIAGNOSIS — E7800 Pure hypercholesterolemia, unspecified: Secondary | ICD-10-CM | POA: Diagnosis not present

## 2023-01-17 ENCOUNTER — Ambulatory Visit
Admission: RE | Admit: 2023-01-17 | Discharge: 2023-01-17 | Disposition: A | Discharge status: Home / Self Care | Payer: Self-pay | Source: Ambulatory Visit | Attending: Family Medicine | Admitting: Family Medicine

## 2023-01-17 DIAGNOSIS — Z1231 Encounter for screening mammogram for malignant neoplasm of breast: Secondary | ICD-10-CM

## 2023-02-12 DIAGNOSIS — R6889 Other general symptoms and signs: Secondary | ICD-10-CM | POA: Diagnosis not present

## 2023-02-12 DIAGNOSIS — Z23 Encounter for immunization: Secondary | ICD-10-CM | POA: Diagnosis not present

## 2023-04-17 DIAGNOSIS — H353132 Nonexudative age-related macular degeneration, bilateral, intermediate dry stage: Secondary | ICD-10-CM | POA: Diagnosis not present

## 2023-04-17 DIAGNOSIS — H02834 Dermatochalasis of left upper eyelid: Secondary | ICD-10-CM | POA: Diagnosis not present

## 2023-04-17 DIAGNOSIS — H04123 Dry eye syndrome of bilateral lacrimal glands: Secondary | ICD-10-CM | POA: Diagnosis not present

## 2023-04-17 DIAGNOSIS — H25813 Combined forms of age-related cataract, bilateral: Secondary | ICD-10-CM | POA: Diagnosis not present

## 2023-04-17 DIAGNOSIS — H02831 Dermatochalasis of right upper eyelid: Secondary | ICD-10-CM | POA: Diagnosis not present

## 2023-05-07 DIAGNOSIS — E7800 Pure hypercholesterolemia, unspecified: Secondary | ICD-10-CM | POA: Diagnosis not present

## 2023-05-07 DIAGNOSIS — Z6836 Body mass index (BMI) 36.0-36.9, adult: Secondary | ICD-10-CM | POA: Diagnosis not present

## 2023-05-07 DIAGNOSIS — E669 Obesity, unspecified: Secondary | ICD-10-CM | POA: Diagnosis not present

## 2023-05-07 DIAGNOSIS — K760 Fatty (change of) liver, not elsewhere classified: Secondary | ICD-10-CM | POA: Diagnosis not present

## 2023-05-07 DIAGNOSIS — K7581 Nonalcoholic steatohepatitis (NASH): Secondary | ICD-10-CM | POA: Diagnosis not present

## 2023-05-07 DIAGNOSIS — I10 Essential (primary) hypertension: Secondary | ICD-10-CM | POA: Diagnosis not present

## 2023-05-07 DIAGNOSIS — K219 Gastro-esophageal reflux disease without esophagitis: Secondary | ICD-10-CM | POA: Diagnosis not present

## 2023-05-07 DIAGNOSIS — E1169 Type 2 diabetes mellitus with other specified complication: Secondary | ICD-10-CM | POA: Diagnosis not present

## 2023-05-07 DIAGNOSIS — E559 Vitamin D deficiency, unspecified: Secondary | ICD-10-CM | POA: Diagnosis not present

## 2023-05-07 DIAGNOSIS — G8929 Other chronic pain: Secondary | ICD-10-CM | POA: Diagnosis not present

## 2023-05-09 DIAGNOSIS — Z6835 Body mass index (BMI) 35.0-35.9, adult: Secondary | ICD-10-CM | POA: Diagnosis not present

## 2023-05-09 DIAGNOSIS — K219 Gastro-esophageal reflux disease without esophagitis: Secondary | ICD-10-CM | POA: Diagnosis not present

## 2023-05-09 DIAGNOSIS — G8929 Other chronic pain: Secondary | ICD-10-CM | POA: Diagnosis not present

## 2023-05-09 DIAGNOSIS — H25813 Combined forms of age-related cataract, bilateral: Secondary | ICD-10-CM | POA: Diagnosis not present

## 2023-05-09 DIAGNOSIS — G709 Myoneural disorder, unspecified: Secondary | ICD-10-CM | POA: Diagnosis not present

## 2023-05-09 DIAGNOSIS — Z79899 Other long term (current) drug therapy: Secondary | ICD-10-CM | POA: Diagnosis not present

## 2023-05-09 DIAGNOSIS — K769 Liver disease, unspecified: Secondary | ICD-10-CM | POA: Diagnosis not present

## 2023-05-09 DIAGNOSIS — E1136 Type 2 diabetes mellitus with diabetic cataract: Secondary | ICD-10-CM | POA: Diagnosis not present

## 2023-05-09 DIAGNOSIS — Z882 Allergy status to sulfonamides status: Secondary | ICD-10-CM | POA: Diagnosis not present

## 2023-05-09 DIAGNOSIS — I10 Essential (primary) hypertension: Secondary | ICD-10-CM | POA: Diagnosis not present

## 2023-05-09 DIAGNOSIS — H25812 Combined forms of age-related cataract, left eye: Secondary | ICD-10-CM | POA: Diagnosis not present

## 2023-05-09 DIAGNOSIS — E669 Obesity, unspecified: Secondary | ICD-10-CM | POA: Diagnosis not present

## 2023-05-16 DIAGNOSIS — H2512 Age-related nuclear cataract, left eye: Secondary | ICD-10-CM | POA: Diagnosis not present

## 2023-05-16 DIAGNOSIS — H3561 Retinal hemorrhage, right eye: Secondary | ICD-10-CM | POA: Diagnosis not present

## 2023-05-16 DIAGNOSIS — H2511 Age-related nuclear cataract, right eye: Secondary | ICD-10-CM | POA: Diagnosis not present

## 2023-05-16 DIAGNOSIS — E119 Type 2 diabetes mellitus without complications: Secondary | ICD-10-CM | POA: Diagnosis not present

## 2023-05-16 DIAGNOSIS — H353130 Nonexudative age-related macular degeneration, bilateral, stage unspecified: Secondary | ICD-10-CM | POA: Diagnosis not present

## 2023-05-16 DIAGNOSIS — H353131 Nonexudative age-related macular degeneration, bilateral, early dry stage: Secondary | ICD-10-CM | POA: Diagnosis not present

## 2023-05-16 DIAGNOSIS — H52223 Regular astigmatism, bilateral: Secondary | ICD-10-CM | POA: Diagnosis not present

## 2023-05-23 DIAGNOSIS — K760 Fatty (change of) liver, not elsewhere classified: Secondary | ICD-10-CM | POA: Diagnosis not present

## 2023-05-23 DIAGNOSIS — I10 Essential (primary) hypertension: Secondary | ICD-10-CM | POA: Diagnosis not present

## 2023-05-23 DIAGNOSIS — H25811 Combined forms of age-related cataract, right eye: Secondary | ICD-10-CM | POA: Diagnosis not present

## 2023-05-23 DIAGNOSIS — K219 Gastro-esophageal reflux disease without esophagitis: Secondary | ICD-10-CM | POA: Diagnosis not present

## 2023-05-23 DIAGNOSIS — Z79899 Other long term (current) drug therapy: Secondary | ICD-10-CM | POA: Diagnosis not present

## 2023-05-23 DIAGNOSIS — E785 Hyperlipidemia, unspecified: Secondary | ICD-10-CM | POA: Diagnosis not present

## 2023-05-23 DIAGNOSIS — E669 Obesity, unspecified: Secondary | ICD-10-CM | POA: Diagnosis not present

## 2023-05-23 DIAGNOSIS — E1136 Type 2 diabetes mellitus with diabetic cataract: Secondary | ICD-10-CM | POA: Diagnosis not present

## 2023-05-23 DIAGNOSIS — K7581 Nonalcoholic steatohepatitis (NASH): Secondary | ICD-10-CM | POA: Diagnosis not present

## 2023-05-30 DIAGNOSIS — H2512 Age-related nuclear cataract, left eye: Secondary | ICD-10-CM | POA: Diagnosis not present

## 2023-05-30 DIAGNOSIS — H2511 Age-related nuclear cataract, right eye: Secondary | ICD-10-CM | POA: Diagnosis not present

## 2023-09-06 DIAGNOSIS — I10 Essential (primary) hypertension: Secondary | ICD-10-CM | POA: Diagnosis not present

## 2023-09-06 DIAGNOSIS — E669 Obesity, unspecified: Secondary | ICD-10-CM | POA: Diagnosis not present

## 2023-09-06 DIAGNOSIS — K7581 Nonalcoholic steatohepatitis (NASH): Secondary | ICD-10-CM | POA: Diagnosis not present

## 2023-09-06 DIAGNOSIS — K219 Gastro-esophageal reflux disease without esophagitis: Secondary | ICD-10-CM | POA: Diagnosis not present

## 2023-09-06 DIAGNOSIS — G8929 Other chronic pain: Secondary | ICD-10-CM | POA: Diagnosis not present

## 2023-09-06 DIAGNOSIS — E1169 Type 2 diabetes mellitus with other specified complication: Secondary | ICD-10-CM | POA: Diagnosis not present

## 2023-09-06 DIAGNOSIS — E7800 Pure hypercholesterolemia, unspecified: Secondary | ICD-10-CM | POA: Diagnosis not present

## 2023-09-06 DIAGNOSIS — Z6836 Body mass index (BMI) 36.0-36.9, adult: Secondary | ICD-10-CM | POA: Diagnosis not present

## 2023-09-11 DIAGNOSIS — L821 Other seborrheic keratosis: Secondary | ICD-10-CM | POA: Diagnosis not present

## 2023-09-11 DIAGNOSIS — L280 Lichen simplex chronicus: Secondary | ICD-10-CM | POA: Diagnosis not present

## 2023-12-12 DIAGNOSIS — Z1389 Encounter for screening for other disorder: Secondary | ICD-10-CM | POA: Diagnosis not present

## 2023-12-12 DIAGNOSIS — Z0000 Encounter for general adult medical examination without abnormal findings: Secondary | ICD-10-CM | POA: Diagnosis not present

## 2023-12-12 DIAGNOSIS — Z6836 Body mass index (BMI) 36.0-36.9, adult: Secondary | ICD-10-CM | POA: Diagnosis not present

## 2024-02-06 ENCOUNTER — Other Ambulatory Visit: Payer: Self-pay | Admitting: Oncology

## 2024-02-06 NOTE — Progress Notes (Signed)
 " [facility] [address] [place],  [place]  [address] [phone]  Clinic Day:  02/07/2024  Referring physician: Gayl Males, MD  HISTORY OF PRESENT ILLNESS:  The patient is a 69 y.o. female  who I was asked to consult upon for possible hemochromatosis.    As her brother and nephew were recently found to have hemochromatosis, she comes in today to determine if she may have this iron overload disorder as well.  Of note, an iron profile in October 2024 revealed a normal ferritin at 124.8, a serum iron of 70, a TIBC of 309.4, and an iron saturation of 22.6%.  The patient had a partial hysterectomy at age 24.  She does have mildly elevated liver transaminases, which she believes are related to her fatty liver disease.  However, she understands hemochromatosis needs to be ruled out as the potential cause behind her elevated liver enzymes.  Overall, she claims to be doing well.  She denies having any systemic symptoms which concern her for hemochromatosis overtly being present.    PAST MEDICAL HISTORY:   Past Medical History:  Diagnosis Date   ALLERGIC RHINITIS 07/20/2009   Qualifier: Diagnosis of  By: Shelah MD, Lamar RAMAN    Asthma    INTERMIETTENT ASTHMA USES INHALER PRN   Calcium deposit in bursa of elbow, left    AND RIGHT PSEUDO GOUT   Carpal tunnel syndrome    BILATERAL   Chronic lower back pain    Diet-controlled type 2 diabetes mellitus (HCC)    Dysrhythmia    HEART PAPITATIONS   Family history of adverse reaction to anesthesia    SISTER TROUBLE WAKING UP   FATTY LIVER DISEASE, HX OF 10/29/2008   Qualifier: Diagnosis of  By: Wynetta, CMA, Carol     Fibromyalgia    GERD (gastroesophageal reflux disease)    Gout    Hyperlipidemia    Hypertension    IBS (irritable bowel syndrome)    Intermittent constipation    Mitral valve prolapse    MVP (mitral valve prolapse)    Osteopenia    Pancreatitis 20 YRS AGO   Pericardial effusion    Chronic    Renal stones    SCARRING INSIDE BOTH KIDNEYS PER PATIENT    PAST SURGICAL HISTORY:   Past Surgical History:  Procedure Laterality Date   CHOLECYSTECTOMY  1993   LAPAROSCOPIC IN [place]   ELBOW FRACTURE SURGERY Right 1973   IR TRANSCATHETER BX  10/27/2022   IR US  GUIDE VASC ACCESS RIGHT  10/27/2022   IR VENOGRAM HEPATIC W HEMODYNAMIC EVALUATION  10/27/2022   KNEE ARTHROSCOPY Right 2017   LUMBAR LAMINECTOMY  1998 & 2008   2 OR 3 RAY CARGES LOWER BACK WITH TITANOIM PLATES AND SCREWS PER PT   PARTIAL KNEE ARTHROPLASTY Right 08/27/2017   Procedure: UNICOMPARTMENTAL RIGHT KNEE;  Surgeon: Rubie Kemps, MD;  Location: [facility];  Service: Orthopedics;  Laterality: Right;  Adductor Block   right shoulder clavical reduction for arthritis  2016   SHOULDER SURGERY Left    by Dr. Dennise for bone spurs   TONSILLECTOMY AND ADENOIDECTOMY  AS CHILD   TOTAL ABDOMINAL HYSTERECTOMY W/ BILATERAL SALPINGOOPHORECTOMY  1988   for endometriosis   CURRENT MEDICATIONS:   Current Outpatient Medications  Medication Sig Dispense Refill   Lancets (ONETOUCH DELICA PLUS LANCET33G) MISC SMARTSIG:1 Topical Daily     ONETOUCH ULTRA test strip 1 each daily.     albuterol (PROVENTIL HFA;VENTOLIN HFA)  108 (90 Base) MCG/ACT inhaler Inhale 1-2 puffs into the lungs every 6 (six) hours as needed for wheezing or shortness of breath.     allopurinol (ZYLOPRIM) 300 MG tablet Take 300 mg by mouth at bedtime.      aspirin  EC 81 MG tablet Take 81 mg by mouth daily. Swallow whole.     atenolol (TENORMIN) 50 MG tablet Take 50 mg by mouth at bedtime.      cyclobenzaprine (FLEXERIL) 10 MG tablet Take 10 mg by mouth 3 (three) times daily as needed ((typically scheduled twice daily)). For back pain     diazepam (VALIUM) 5 MG tablet Take 5 mg by mouth 2 (two) times daily as needed for anxiety.      esomeprazole  (NEXIUM ) 40 MG capsule Take 1 capsule (40 mg total) by mouth daily. 90 capsule 4   fluticasone (FLONASE) 50 MCG/ACT nasal  spray Place 2 sprays into both nostrils daily.     gabapentin  (NEURONTIN ) 300 MG capsule Take 600 mg by mouth at bedtime.     gentamicin ointment (GARAMYCIN) 0.1 % Apply 1 application topically 3 (three) times daily as needed (for skin rash).      hydrOXYzine (ATARAX/VISTARIL) 10 MG tablet Take 10 mg by mouth every 6 (six) hours as needed for itching.     hyoscyamine (LEVSIN SL) 0.125 MG SL tablet Place 0.125 mg under the tongue 3 (three) times daily as needed (for abdominal pain (IBS)).     meclizine (ANTIVERT) 25 MG tablet Take 25 mg by mouth every 6 (six) hours as needed for dizziness.      Milk Thistle 175 MG CAPS      Oxycodone  HCl 10 MG TABS Take 1-2 tablets (10-20 mg total) by mouth every 6 (six) hours as needed. 40 tablet 0   polyethylene glycol (MIRALAX / GLYCOLAX) packet Take 17 g by mouth daily as needed (for constipation.).      pravastatin (PRAVACHOL) 40 MG tablet Take 40 mg by mouth at bedtime.     traZODone (DESYREL) 50 MG tablet Take 50 mg by mouth at bedtime.     triamcinolone  cream (KENALOG ) 0.1 % Apply 1 application topically 2 (two) times daily as needed (For itching and rash).      No current facility-administered medications for this visit.    ALLERGIES:  Allergies[1]  FAMILY HISTORY:   Family History  Problem Relation Age of Onset   Coronary artery disease Mother    Breast cancer Mother    Colon cancer Mother    Lung cancer Mother    COPD Mother    COPD Father    Hypertension Sister    Hyperlipidemia Sister    Breast cancer Sister    Diabetes Sister    Coronary artery disease Brother    Hyperlipidemia Brother    Rheum arthritis Brother    Heart disease Brother    Heart attack Brother    Other Brother        UNK HISTORY   Rheum arthritis Brother    Hyperlipidemia Brother    Hemachromatosis Brother    Rheum arthritis Brother    Breast cancer Maternal Aunt    Breast cancer Maternal Aunt    Breast cancer Daughter    Lung cancer Paternal Uncle      SOCIAL HISTORY:  The patient was born and raised in [place].  [place] her husband of 54 years.  They have 2 children, 5 grandchildren, and 2 great-grandchildren.  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  There is no history of alcoholism or tobacco abuse.  REVIEW OF SYSTEMS:  Review of Systems  Constitutional:  Negative for fatigue and fever.  HENT:   Negative for hearing loss and sore throat.   Eyes:  Positive for eye problems.  Respiratory:  Negative for chest tightness, cough and hemoptysis.   Cardiovascular:  Negative for chest pain and palpitations.  Gastrointestinal:  Negative for abdominal distention, abdominal pain, blood in stool, constipation, diarrhea, nausea and vomiting.  Endocrine: Negative for hot flashes.  Genitourinary:  Negative for difficulty urinating, dysuria, frequency, hematuria and nocturia.   Musculoskeletal:  Positive for arthralgias and back pain. Negative for gait problem and myalgias.  Skin: Negative.  Negative for itching and rash.  Neurological:  Positive for dizziness and headaches. Negative for extremity weakness, gait problem, light-headedness and numbness.  Hematological: Negative.   Psychiatric/Behavioral: Negative.  Negative for depression and suicidal ideas. The patient is not nervous/anxious.    PHYSICAL EXAM:  Blood pressure 127/62, pulse 65, temperature 98.9 F (37.2 C), temperature source Oral, resp. rate 16, height 5' 4 (1.626 m), weight 210 lb 14.4 oz (95.7 kg), SpO2 100%. Wt Readings from Last 3 Encounters:  02/07/24 210 lb 14.4 oz (95.7 kg)  10/27/22 199 lb 4.8 oz (90.4 kg)  08/15/22 206 lb (93.4 kg)   Body mass index is 36.2 kg/m. Performance status (ECOG): 1 - Symptomatic but completely ambulatory Physical Exam Constitutional:      Appearance: Normal appearance. She is not ill-appearing.  HENT:     Mouth/Throat:     Mouth: Mucous membranes are moist.     Pharynx: Oropharynx is  clear. No oropharyngeal exudate or posterior oropharyngeal erythema.  Cardiovascular:     Rate and Rhythm: Normal rate and regular rhythm.     Heart sounds: No murmur heard.    No friction rub. No gallop.  Pulmonary:     Effort: Pulmonary effort is normal. No respiratory distress.     Breath sounds: Normal breath sounds. No wheezing, rhonchi or rales.  Abdominal:     General: Bowel sounds are normal. There is no distension.     Palpations: Abdomen is soft. There is no mass.     Tenderness: There is no abdominal tenderness.  Musculoskeletal:        General: No swelling.     Right lower leg: No edema.     Left lower leg: No edema.  Lymphadenopathy:     Cervical: No cervical adenopathy.     Upper Body:     Right upper body: No supraclavicular or axillary adenopathy.     Left upper body: No supraclavicular or axillary adenopathy.     Lower Body: No right inguinal adenopathy. No left inguinal adenopathy.  Skin:    General: Skin is warm.     Coloration: Skin is not jaundiced.     Findings: No lesion or rash.  Neurological:     General: No focal deficit present.     Mental Status: She is alert and oriented to person, place, and time. Mental status is at baseline.  Psychiatric:        Mood and Affect: Mood normal.        Behavior: Behavior normal.        Thought Content: Thought content normal.    LABS:      Latest Ref Rng & Units 02/07/2024    1:43 PM 10/27/2022   11:04  AM 08/15/2022    2:25 PM  CBC  WBC 4.0 - 10.5 K/uL 7.9  8.3  7.5   Hemoglobin 12.0 - 15.0 g/dL 85.3  85.6  86.6   Hematocrit 36.0 - 46.0 % 43.5  43.3  39.8   Platelets 150 - 400 K/uL 215  175  171    176.0       Latest Ref Rng & Units 02/07/2024    1:43 PM 10/27/2022   11:04 AM 08/15/2022    2:25 PM  CMP  Glucose 70 - 99 mg/dL 854  882  865   BUN 8 - 23 mg/dL 13  13  14    Creatinine 0.44 - 1.00 mg/dL 9.05  9.13  9.06   Sodium 135 - 145 mmol/L 138  136  138   Potassium 3.5 - 5.1 mmol/L 3.7  4.1  4.0    Chloride 98 - 111 mmol/L 101  103  102   CO2 22 - 32 mmol/L 28  23  26    Calcium 8.9 - 10.3 mg/dL 89.9  9.5  9.7   Total Protein 6.5 - 8.1 g/dL 7.8  7.5  7.3    7.5   Total Bilirubin 0.0 - 1.2 mg/dL 0.8  0.9  0.6   Alkaline Phos 38 - 126 U/L 124  97  105   AST 15 - 41 U/L 64  36  56    52   ALT 0 - 44 U/L 58  31  48    49     Latest Reference Range & Units 02/07/24 13:44  Iron 28 - 170 ug/dL 872  UIBC ug/dL 786  TIBC 749 - 549 ug/dL 659  Saturation Ratios 10.4 - 31.8 % 37 (H)  Ferritin 11 - 307 ng/mL 175   ASSESSMENT & PLAN:  A 69 y.o. female who I was asked to consult upon for the possibility of having hemochromatosis.  In clinic today, I will check hemochromatosis mutation testing to determine if this disorder is present.  The patient understands that I would perform periodic phlebotomies if she has the most aggressive form of hemochromatosis (C282Y/C282Y).  If she is just a carrier or does not have any gene mutations at all, the patient understands she will likely not need phlebotomies moving forward.  I will see this patient back in 3 weeks to go over all of her labs collected today and their implications.  The patient understands all the plans discussed today and is in agreement with them.  I do appreciate Gayl Males, MD for his new consult.   Maurisha Mongeau DELENA Kerns, MD            [1]  Allergies Allergen Reactions   Ambien [Zolpidem Tartrate] Other (See Comments)    Irregular rhythm   Sulfonamide Derivatives Hives and Rash   Welchol [Colesevelam Hcl] Other (See Comments)    Liver function abnormalities   Zolpidem Tartrate    Celecoxib Hives and Rash   Cox-2 Inhibitors Rash and Hypertension   Cymbalta [Duloxetine Hcl] Nausea And Vomiting   Mobic [Meloxicam] Other (See Comments)    Heartburn   Rofecoxib Hives and Rash    (Vioxx)   Trazodone And Nefazodone Palpitations   Zyprexa [Olanzapine] Palpitations   "

## 2024-02-07 ENCOUNTER — Inpatient Hospital Stay

## 2024-02-07 ENCOUNTER — Inpatient Hospital Stay: Attending: Oncology | Admitting: Oncology

## 2024-02-07 ENCOUNTER — Encounter: Payer: Self-pay | Admitting: Oncology

## 2024-02-07 LAB — CMP (CANCER CENTER ONLY)
ALT: 58 U/L — ABNORMAL HIGH (ref 0–44)
AST: 64 U/L — ABNORMAL HIGH (ref 15–41)
Albumin: 4.3 g/dL (ref 3.5–5.0)
Alkaline Phosphatase: 124 U/L (ref 38–126)
Anion gap: 10 (ref 5–15)
BUN: 13 mg/dL (ref 8–23)
CO2: 28 mmol/L (ref 22–32)
Calcium: 10 mg/dL (ref 8.9–10.3)
Chloride: 101 mmol/L (ref 98–111)
Creatinine: 0.94 mg/dL (ref 0.44–1.00)
GFR, Estimated: >60 mL/min
Glucose, Bld: 145 mg/dL — ABNORMAL HIGH (ref 70–99)
Potassium: 3.7 mmol/L (ref 3.5–5.1)
Sodium: 138 mmol/L (ref 135–145)
Total Bilirubin: 0.8 mg/dL (ref 0.0–1.2)
Total Protein: 7.8 g/dL (ref 6.5–8.1)

## 2024-02-07 LAB — CBC WITH DIFFERENTIAL (CANCER CENTER ONLY)
Abs Immature Granulocytes: 0.02 10*3/uL (ref 0.00–0.07)
Basophils Absolute: 0.1 10*3/uL (ref 0.0–0.1)
Basophils Relative: 1 %
Eosinophils Absolute: 0.2 10*3/uL (ref 0.0–0.5)
Eosinophils Relative: 2 %
HCT: 43.5 % (ref 36.0–46.0)
Hemoglobin: 14.6 g/dL (ref 12.0–15.0)
Immature Granulocytes: 0 %
Lymphocytes Relative: 31 %
Lymphs Abs: 2.4 10*3/uL (ref 0.7–4.0)
MCH: 29.4 pg (ref 26.0–34.0)
MCHC: 33.6 g/dL (ref 30.0–36.0)
MCV: 87.7 fL (ref 80.0–100.0)
Monocytes Absolute: 0.4 10*3/uL (ref 0.1–1.0)
Monocytes Relative: 5 %
Neutro Abs: 4.8 10*3/uL (ref 1.7–7.7)
Neutrophils Relative %: 61 %
Platelet Count: 215 10*3/uL (ref 150–400)
RBC: 4.96 MIL/uL (ref 3.87–5.11)
RDW: 12.8 % (ref 11.5–15.5)
WBC Count: 7.9 10*3/uL (ref 4.0–10.5)
nRBC: 0 % (ref 0.0–0.2)

## 2024-02-07 LAB — IRON AND TIBC
Iron: 127 ug/dL (ref 28–170)
Saturation Ratios: 37 % — ABNORMAL HIGH (ref 10.4–31.8)
TIBC: 340 ug/dL (ref 250–450)
UIBC: 213 ug/dL

## 2024-02-07 LAB — FERRITIN: Ferritin: 175 ng/mL (ref 11–307)

## 2024-02-12 ENCOUNTER — Other Ambulatory Visit: Payer: Self-pay

## 2024-02-12 LAB — HEMOCHROMATOSIS DNA-PCR(C282Y,H63D)

## 2024-02-12 MED ORDER — ESOMEPRAZOLE MAGNESIUM 40 MG PO CPDR: 40.0000 mg | DELAYED_RELEASE_CAPSULE | Freq: Every day | ORAL | 0 refills | Status: AC

## 2024-02-28 ENCOUNTER — Inpatient Hospital Stay: Admitting: Oncology
